# Patient Record
Sex: Male | Born: 1960
Health system: Southern US, Community
[De-identification: ages and names within clinical notes are randomized; demographics above are authoritative.]

## PROBLEM LIST (undated history)

## (undated) DIAGNOSIS — K5792 Diverticulitis of intestine, part unspecified, without perforation or abscess without bleeding: Secondary | ICD-10-CM

## (undated) DIAGNOSIS — E785 Hyperlipidemia, unspecified: Secondary | ICD-10-CM

## (undated) DIAGNOSIS — I10 Essential (primary) hypertension: Secondary | ICD-10-CM

## (undated) HISTORY — PX: LASIK: SHX215

## (undated) HISTORY — PX: COLONOSCOPY: SHX174

## (undated) HISTORY — PX: HERNIA REPAIR: SHX51

## (undated) HISTORY — PX: UPPER GASTROINTESTINAL ENDOSCOPY: SHX188

---

## 2012-12-04 ENCOUNTER — Other Ambulatory Visit: Payer: Self-pay | Admitting: Gastroenterology

## 2012-12-04 DIAGNOSIS — R109 Unspecified abdominal pain: Secondary | ICD-10-CM

## 2012-12-09 ENCOUNTER — Ambulatory Visit
Admission: RE | Admit: 2012-12-09 | Discharge: 2012-12-09 | Disposition: A | Discharge status: Home / Self Care | Payer: Commercial Managed Care - PPO | Source: Ambulatory Visit | Attending: Gastroenterology | Admitting: Gastroenterology

## 2012-12-09 DIAGNOSIS — R109 Unspecified abdominal pain: Secondary | ICD-10-CM

## 2012-12-09 MED ORDER — IOHEXOL 300 MG/ML  SOLN
125.0000 mL | Freq: Once | INTRAMUSCULAR | Status: AC | PRN
  Administered 2012-12-09: 125 mL via INTRAVENOUS

## 2014-09-17 ENCOUNTER — Encounter (HOSPITAL_BASED_OUTPATIENT_CLINIC_OR_DEPARTMENT_OTHER): Payer: Self-pay | Admitting: *Deleted

## 2014-09-17 ENCOUNTER — Emergency Department (HOSPITAL_BASED_OUTPATIENT_CLINIC_OR_DEPARTMENT_OTHER)
Admission: EM | Admit: 2014-09-17 | Discharge: 2014-09-17 | Disposition: A | Discharge status: Home / Self Care | Payer: 59 | Attending: Emergency Medicine | Admitting: Emergency Medicine

## 2014-09-17 ENCOUNTER — Emergency Department (HOSPITAL_BASED_OUTPATIENT_CLINIC_OR_DEPARTMENT_OTHER): Payer: 59

## 2014-09-17 DIAGNOSIS — Z880 Allergy status to penicillin: Secondary | ICD-10-CM | POA: Diagnosis not present

## 2014-09-17 DIAGNOSIS — I10 Essential (primary) hypertension: Secondary | ICD-10-CM | POA: Insufficient documentation

## 2014-09-17 DIAGNOSIS — S52022A Displaced fracture of olecranon process without intraarticular extension of left ulna, initial encounter for closed fracture: Secondary | ICD-10-CM

## 2014-09-17 DIAGNOSIS — Z79899 Other long term (current) drug therapy: Secondary | ICD-10-CM | POA: Diagnosis not present

## 2014-09-17 DIAGNOSIS — T1490XA Injury, unspecified, initial encounter: Secondary | ICD-10-CM

## 2014-09-17 DIAGNOSIS — Y92410 Unspecified street and highway as the place of occurrence of the external cause: Secondary | ICD-10-CM | POA: Diagnosis not present

## 2014-09-17 DIAGNOSIS — Y9389 Activity, other specified: Secondary | ICD-10-CM | POA: Diagnosis not present

## 2014-09-17 DIAGNOSIS — Z720 Tobacco use: Secondary | ICD-10-CM | POA: Insufficient documentation

## 2014-09-17 DIAGNOSIS — S59902A Unspecified injury of left elbow, initial encounter: Secondary | ICD-10-CM | POA: Diagnosis present

## 2014-09-17 HISTORY — DX: Essential (primary) hypertension: I10

## 2014-09-17 MED ORDER — PROMETHAZINE HCL 25 MG PO TABS: 25.0000 mg | ORAL_TABLET | Freq: Four times a day (QID) | ORAL | Status: DC | PRN

## 2014-09-17 MED ORDER — OXYCODONE-ACETAMINOPHEN 5-325 MG PO TABS: ORAL_TABLET | ORAL | Status: DC

## 2014-09-17 MED ORDER — MORPHINE SULFATE 4 MG/ML IJ SOLN
4.0000 mg | Freq: Once | INTRAMUSCULAR | Status: AC
  Administered 2014-09-17: 4 mg via INTRAMUSCULAR
  Filled 2014-09-17: qty 1

## 2014-09-17 MED ORDER — OXYCODONE-ACETAMINOPHEN 5-325 MG PO TABS
1.0000 | ORAL_TABLET | Freq: Once | ORAL | Status: DC
Start: 2014-09-17 — End: 2014-09-17
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
## 2014-09-17 NOTE — Discharge Instructions (Signed)
Do not eat or drink anything after midnight except for your medications with sips of water.  Go to Dr. Bettina Gavia office at 9 AM tomorrow.  Take percocet for breakthrough pain, do not drink alcohol, drive, care for children or do other critical tasks while taking percocet.  Please follow with your primary care doctor in the next 2 days for a check-up. They must obtain records for further management.   Do not hesitate to return to the Emergency Department for any new, worsening or concerning symptoms.    Elbow Fracture with Open Reduction and Internal Fixation (ORIF) A fracture (break in bone) of the elbow means one of the bones that comprises the elbow is broken. If fractures are not displaced (separated), they may be treated conservatively. That means that only a sling or splint may be required for two to three weeks. Often, elbow fractures are treated by early range of motion exercises to prevent the elbow from getting stiff. If fractures are large and not stable, an operation may be required to put the bones back into proper position and hold them in place with:  Pins.  Plates.  Screws. This is called open reduction and internal fixation (ORIF). The main goal of treating fractured elbows is to get the bones back into position and keep them in place. This goal gives the best chance of an elbow that:  Works as normally as possible.  Has the optimum range of motion. DIAGNOSIS  The diagnosis of a fractured elbow is made by x-ray. These will be required before and after the elbow is fixed. RISKS AND COMPLICATIONS All surgery is associated with risks. Some of these risks are:  Excessive bleeding.  Failure to heal properly (non-union).  Infection.  Stiffness of elbow following injury.  Damage to one of the nerves around the elbow producing numbness or weakness. LET YOUR CAREGIVER KNOW ABOUT:  Allergies.  Medications taken including herbs, eye drops, over the counter medications,  and creams.  Use of steroids (by mouth or creams).  Previous problems with anesthetics or novocaine.  Any numbness or tingling in your hand/forearm.  Possibility of pregnancy, if this applies.  History of blood clots (thrombophlebitis).  History of bleeding or blood problems.  Previous surgery.  Other health problems.  Family history of anesthetic problems PROCEDURE  You will be given an anesthetic which will keep you pain free during surgery. This will be accomplished by a general anesthetic (you go to sleep) or regional anesthesia (your arm is made numb). After the surgery you will be taken to the recovery area where a nurse will monitor your progress. When you are stable, taking fluids well and provided there are no complications, you will be allowed to return to your hospital room or possibly even go home. AFTER THE PROCEDURE   Only take over-the-counter or prescription medicines for pain, discomfort, or fever as directed by your caregiver.  You may use ice for 15-20 minutes, 03-04 times per day, for the first 2 to 3 days.  Change dressings and see your caregiver as directed.  Your caregiver will instruct you when to begin using and exercising your arm and elbow. SEEK IMMEDIATE MEDICAL CARE IF:  There is redness, swelling, or increasing pain in the surgical area.  Pus, blood or unusual drainage is coming from the area or is visible on the dressings or cast.  An unexplained oral temperature above 102 F (38.9 C) develops.  You notice a foul smell coming from the surgical site or  dressing.  The wound breaks open (edges are not staying together) after stitches have been removed.  You develop increasing pain or increasing pain with motion of your fingers.  There is numbness or tingling in your hand or forearm.  You have any other questions or concerns following surgery. Document Released: 04/24/2001 Document Revised: 01/21/2012 Document Reviewed: 11/15/2008 [facility]  Patient Information 2015 [place], [place]. This information is not intended to replace advice given to you by your health care provider. Make sure you discuss any questions you have with your health care provider.
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
## 2014-09-18 ENCOUNTER — Inpatient Hospital Stay (HOSPITAL_COMMUNITY): Payer: 59 | Admitting: Certified Registered Nurse Anesthetist

## 2014-09-18 ENCOUNTER — Encounter (HOSPITAL_COMMUNITY): Admission: EM | Disposition: A | Discharge status: Home / Self Care | Payer: Self-pay | Attending: Orthopedic Surgery

## 2014-09-18 ENCOUNTER — Ambulatory Visit (HOSPITAL_COMMUNITY)
Admission: EM | Admit: 2014-09-18 | Discharge: 2014-09-18 | Disposition: A | Discharge status: Home / Self Care | Payer: 59 | Source: Intra-hospital | Attending: Orthopedic Surgery | Admitting: Orthopedic Surgery

## 2014-09-18 ENCOUNTER — Ambulatory Visit (HOSPITAL_COMMUNITY): Admission: EM | Admit: 2014-09-18 | Payer: 59

## 2014-09-18 ENCOUNTER — Encounter (HOSPITAL_COMMUNITY): Payer: Self-pay | Admitting: *Deleted

## 2014-09-18 DIAGNOSIS — Y998 Other external cause status: Secondary | ICD-10-CM | POA: Diagnosis not present

## 2014-09-18 DIAGNOSIS — F17210 Nicotine dependence, cigarettes, uncomplicated: Secondary | ICD-10-CM | POA: Insufficient documentation

## 2014-09-18 DIAGNOSIS — Z880 Allergy status to penicillin: Secondary | ICD-10-CM | POA: Diagnosis not present

## 2014-09-18 DIAGNOSIS — I10 Essential (primary) hypertension: Secondary | ICD-10-CM | POA: Diagnosis not present

## 2014-09-18 DIAGNOSIS — Y92488 Other paved roadways as the place of occurrence of the external cause: Secondary | ICD-10-CM | POA: Insufficient documentation

## 2014-09-18 DIAGNOSIS — Y93I9 Activity, other involving external motion: Secondary | ICD-10-CM | POA: Insufficient documentation

## 2014-09-18 DIAGNOSIS — S52022A Displaced fracture of olecranon process without intraarticular extension of left ulna, initial encounter for closed fracture: Secondary | ICD-10-CM | POA: Insufficient documentation

## 2014-09-18 HISTORY — PX: ORIF ELBOW FRACTURE: SHX5031

## 2014-09-18 LAB — BASIC METABOLIC PANEL
Anion gap: 15 (ref 5–15)
BUN: 13 mg/dL (ref 6–23)
CO2: 24 mEq/L (ref 19–32)
Calcium: 9.6 mg/dL (ref 8.4–10.5)
Chloride: 99 mEq/L (ref 96–112)
Creatinine, Ser: 0.98 mg/dL (ref 0.50–1.35)
GFR calc Af Amer: >90 mL/min (ref >90)
GFR calc non Af Amer: >90 mL/min (ref >90)
Glucose, Bld: 87 mg/dL (ref 70–99)
Potassium: 3.7 mEq/L (ref 3.7–5.3)
Sodium: 138 mEq/L (ref 137–147)

## 2014-09-18 LAB — CBC
HCT: 46.7 % (ref 39.0–52.0)
Hemoglobin: 16.2 g/dL (ref 13.0–17.0)
MCH: 30.7 pg (ref 26.0–34.0)
MCHC: 34.7 g/dL (ref 30.0–36.0)
MCV: 88.4 fL (ref 78.0–100.0)
Platelets: 314 10*3/uL (ref 150–400)
RBC: 5.28 MIL/uL (ref 4.22–5.81)
RDW: 13.7 % (ref 11.5–15.5)
WBC: 10.5 10*3/uL (ref 4.0–10.5)

## 2014-09-18 SURGERY — OPEN REDUCTION INTERNAL FIXATION (ORIF) ELBOW/OLECRANON FRACTURE
Anesthesia: General | Site: Elbow | Laterality: Left

## 2014-09-18 SURGERY — OPEN REDUCTION INTERNAL FIXATION (ORIF) ELBOW/OLECRANON FRACTURE
Anesthesia: General | Laterality: Left

## 2014-09-18 MED ORDER — HYDROMORPHONE HCL 1 MG/ML IJ SOLN
INTRAMUSCULAR | Status: AC
  Administered 2014-09-18: 0.5 mg via INTRAVENOUS
  Filled 2014-09-18: qty 1

## 2014-09-18 MED ORDER — OXYCODONE HCL 5 MG PO TABS
ORAL_TABLET | ORAL | Status: AC
  Administered 2014-09-18: 5 mg via ORAL
  Filled 2014-09-18: qty 1

## 2014-09-18 MED ORDER — PROPOFOL 10 MG/ML IV BOLUS
INTRAVENOUS | Status: DC | PRN
  Administered 2014-09-18: 200 mg via INTRAVENOUS

## 2014-09-18 MED ORDER — BUPIVACAINE-EPINEPHRINE (PF) 0.25% -1:200000 IJ SOLN
INTRAMUSCULAR | Status: AC
  Filled 2014-09-18: qty 30

## 2014-09-18 MED ORDER — OXYCODONE-ACETAMINOPHEN 5-325 MG PO TABS
ORAL_TABLET | ORAL | Status: DC
Start: 2014-09-18 — End: 2017-02-28

## 2014-09-18 MED ORDER — OXYCODONE HCL 5 MG/5ML PO SOLN: 5.0000 mg | Freq: Once | ORAL | Status: AC | PRN

## 2014-09-18 MED ORDER — BUPIVACAINE-EPINEPHRINE 0.25% -1:200000 IJ SOLN
INTRAMUSCULAR | Status: DC | PRN
  Administered 2014-09-18: 20 mL

## 2014-09-18 MED ORDER — FENTANYL CITRATE 0.05 MG/ML IJ SOLN
INTRAMUSCULAR | Status: DC | PRN
  Administered 2014-09-18: 25 ug via INTRAVENOUS
  Administered 2014-09-18: 50 ug via INTRAVENOUS
  Administered 2014-09-18 (×3): 25 ug via INTRAVENOUS
  Administered 2014-09-18 (×2): 50 ug via INTRAVENOUS

## 2014-09-18 MED ORDER — HYDROMORPHONE HCL 1 MG/ML IJ SOLN
0.2500 mg | INTRAMUSCULAR | Status: DC | PRN
  Administered 2014-09-18 (×4): 0.5 mg via INTRAVENOUS

## 2014-09-18 MED ORDER — EPHEDRINE SULFATE 50 MG/ML IJ SOLN
INTRAMUSCULAR | Status: DC | PRN
Start: 2014-09-18 — End: 2014-09-18
  Administered 2014-09-18: 5 mg via INTRAVENOUS

## 2014-09-18 MED ORDER — LACTATED RINGERS IV SOLN
INTRAVENOUS | Status: DC | PRN
  Administered 2014-09-18: 12:00:00 via INTRAVENOUS

## 2014-09-18 MED ORDER — LACTATED RINGERS IV SOLN
INTRAVENOUS | Status: DC
  Administered 2014-09-18: 12:00:00 via INTRAVENOUS

## 2014-09-18 MED ORDER — MIDAZOLAM HCL 5 MG/5ML IJ SOLN
INTRAMUSCULAR | Status: DC | PRN
  Administered 2014-09-18: 2 mg via INTRAVENOUS

## 2014-09-18 MED ORDER — 0.9 % SODIUM CHLORIDE (POUR BTL) OPTIME
TOPICAL | Status: DC | PRN
  Administered 2014-09-18: 1000 mL

## 2014-09-18 MED ORDER — MIDAZOLAM HCL 2 MG/2ML IJ SOLN
INTRAMUSCULAR | Status: AC
  Filled 2014-09-18: qty 2

## 2014-09-18 MED ORDER — PROPOFOL 10 MG/ML IV BOLUS
INTRAVENOUS | Status: AC
  Filled 2014-09-18: qty 20

## 2014-09-18 MED ORDER — OXYCODONE HCL 5 MG PO TABS
5.0000 mg | ORAL_TABLET | Freq: Once | ORAL | Status: AC | PRN
  Administered 2014-09-18: 5 mg via ORAL

## 2014-09-18 MED ORDER — FENTANYL CITRATE 0.05 MG/ML IJ SOLN
INTRAMUSCULAR | Status: AC
  Filled 2014-09-18: qty 5

## 2014-09-18 MED ORDER — LIDOCAINE HCL (CARDIAC) 20 MG/ML IV SOLN
INTRAVENOUS | Status: DC | PRN
  Administered 2014-09-18: 80 mg via INTRAVENOUS

## 2014-09-18 MED ORDER — SUCCINYLCHOLINE CHLORIDE 20 MG/ML IJ SOLN
INTRAMUSCULAR | Status: DC | PRN
  Administered 2014-09-18: 150 mg via INTRAVENOUS

## 2014-09-18 MED ORDER — ONDANSETRON HCL 4 MG/2ML IJ SOLN
INTRAMUSCULAR | Status: DC | PRN
  Administered 2014-09-18: 4 mg via INTRAVENOUS

## 2014-09-18 MED ORDER — PROMETHAZINE HCL 25 MG/ML IJ SOLN: 6.2500 mg | INTRAMUSCULAR | Status: DC | PRN

## 2014-09-18 MED ORDER — CEFAZOLIN SODIUM-DEXTROSE 2-3 GM-% IV SOLR
2.0000 g | INTRAVENOUS | Status: AC
  Administered 2014-09-18: 2 g via INTRAVENOUS

## 2014-09-18 SURGICAL SUPPLY — 73 items
0.62 K WIRE ×2 IMPLANT
BANDAGE ELASTIC 3 VELCRO ST LF (GAUZE/BANDAGES/DRESSINGS) ×2 IMPLANT
BANDAGE ELASTIC 4 VELCRO ST LF (GAUZE/BANDAGES/DRESSINGS) ×4 IMPLANT
BIT DRILL 2.0 LNG QUCK RELEASE (BIT) ×1 IMPLANT
BIT DRILL 2.8 QUICK RELEASE (BIT) ×1 IMPLANT
BLADE SURG ROTATE 9660 (MISCELLANEOUS) IMPLANT
BNDG COHESIVE 4X5 TAN STRL (GAUZE/BANDAGES/DRESSINGS) ×2 IMPLANT
BNDG ESMARK 4X9 LF (GAUZE/BANDAGES/DRESSINGS) ×2 IMPLANT
BNDG GAUZE ELAST 4 BULKY (GAUZE/BANDAGES/DRESSINGS) ×2 IMPLANT
CHLORAPREP W/TINT 26ML (MISCELLANEOUS) ×2 IMPLANT
COVER SURGICAL LIGHT HANDLE (MISCELLANEOUS) ×2 IMPLANT
CUFF TOURNIQUET SINGLE 18IN (TOURNIQUET CUFF) ×2 IMPLANT
CUFF TOURNIQUET SINGLE 24IN (TOURNIQUET CUFF) IMPLANT
DRAPE INCISE IOBAN 66X45 STRL (DRAPES) ×2 IMPLANT
DRAPE OEC MINIVIEW 54X84 (DRAPES) IMPLANT
DRAPE ORTHO SPLIT 77X108 STRL (DRAPES) ×1
DRAPE SURG ORHT 6 SPLT 77X108 (DRAPES) ×1 IMPLANT
DRAPE U-SHAPE 47X51 STRL (DRAPES) ×2 IMPLANT
DRILL 2.0 LNG QUICK RELEASE (BIT) ×2
DRILL 2.8 QUICK RELEASE (BIT) ×2
ELECT REM PT RETURN 9FT ADLT (ELECTROSURGICAL)
ELECTRODE REM PT RTRN 9FT ADLT (ELECTROSURGICAL) IMPLANT
GAUZE SPONGE 4X4 12PLY STRL (GAUZE/BANDAGES/DRESSINGS) ×2 IMPLANT
GAUZE XEROFORM 5X9 LF (GAUZE/BANDAGES/DRESSINGS) ×2 IMPLANT
GLOVE BIO SURGEON STRL SZ7 (GLOVE) ×2 IMPLANT
GLOVE BIO SURGEON STRL SZ7.5 (GLOVE) ×2 IMPLANT
GLOVE BIOGEL PI IND STRL 8 (GLOVE) ×1 IMPLANT
GLOVE BIOGEL PI INDICATOR 8 (GLOVE) ×1
GOWN STRL REUS W/ TWL LRG LVL3 (GOWN DISPOSABLE) ×4 IMPLANT
GOWN STRL REUS W/ TWL XL LVL3 (GOWN DISPOSABLE) ×1 IMPLANT
GOWN STRL REUS W/TWL LRG LVL3 (GOWN DISPOSABLE) ×4
GOWN STRL REUS W/TWL XL LVL3 (GOWN DISPOSABLE) ×1
KIT BASIN OR (CUSTOM PROCEDURE TRAY) ×2 IMPLANT
KIT ROOM TURNOVER OR (KITS) ×2 IMPLANT
LOOP VESSEL MAXI BLUE (MISCELLANEOUS) IMPLANT
MANIFOLD NEPTUNE II (INSTRUMENTS) ×2 IMPLANT
NEEDLE 22X1 1/2 (OR ONLY) (NEEDLE) IMPLANT
NEEDLE HYPO 25GX1X1/2 BEV (NEEDLE) ×2 IMPLANT
NS IRRIG 1000ML POUR BTL (IV SOLUTION) ×2 IMPLANT
PACK ORTHO EXTREMITY (CUSTOM PROCEDURE TRAY) ×2 IMPLANT
PAD ARMBOARD 7.5X6 YLW CONV (MISCELLANEOUS) ×4 IMPLANT
PAD CAST 4YDX4 CTTN HI CHSV (CAST SUPPLIES) ×1 IMPLANT
PADDING CAST COTTON 4X4 STRL (CAST SUPPLIES) ×1
PADDING CAST COTTON 6X4 STRL (CAST SUPPLIES) ×6 IMPLANT
PLATE LEFT OLECRANON 3HOLE (Plate) ×2 IMPLANT
SCREW HEX NON LOCK 3.5X34MM (Screw) ×2 IMPLANT
SCREW HEX NON LOCK 3.5X55MM (Screw) ×2 IMPLANT
SCREW LOCK 18X2.7X HEXALOBE (Screw) ×2 IMPLANT
SCREW LOCK 20X2.7X HEXALOBE (Screw) ×2 IMPLANT
SCREW LOCKING 2.7X18MM (Screw) ×2 IMPLANT
SCREW LOCKING 2.7X20MM (Screw) ×2 IMPLANT
SCREW NON LOCKING HEX 3.5X24 (Screw) ×2 IMPLANT
SCREW NON LOCKING HEX 3.5X30 (Screw) ×2 IMPLANT
SLING ARM IMMOBILIZER XL (CAST SUPPLIES) ×2 IMPLANT
SPLINT PLASTER CAST XFAST 5X30 (CAST SUPPLIES) ×1 IMPLANT
SPLINT PLASTER XFAST SET 5X30 (CAST SUPPLIES) ×1
SPONGE GAUZE 4X4 12PLY STER LF (GAUZE/BANDAGES/DRESSINGS) ×2 IMPLANT
SPONGE LAP 18X18 X RAY DECT (DISPOSABLE) ×2 IMPLANT
SPONGE LAP 4X18 X RAY DECT (DISPOSABLE) ×2 IMPLANT
STAPLER VISISTAT 35W (STAPLE) ×2 IMPLANT
STOCKINETTE IMPERVIOUS 9X36 MD (GAUZE/BANDAGES/DRESSINGS) ×2 IMPLANT
SUCTION FRAZIER TIP 10 FR DISP (SUCTIONS) ×2 IMPLANT
SUT ETHILON 4 0 PS 2 18 (SUTURE) ×2 IMPLANT
SUT VIC AB 0 CT1 27 (SUTURE) ×1
SUT VIC AB 0 CT1 27XBRD ANBCTR (SUTURE) ×1 IMPLANT
SUT VIC AB 2-0 CT1 36 (SUTURE) ×2 IMPLANT
SUT VIC AB 2-0 FS1 27 (SUTURE) ×2 IMPLANT
SYR CONTROL 10ML LL (SYRINGE) ×2 IMPLANT
TOWEL OR 17X24 6PK STRL BLUE (TOWEL DISPOSABLE) ×2 IMPLANT
TOWEL OR 17X26 10 PK STRL BLUE (TOWEL DISPOSABLE) ×2 IMPLANT
TUBE CONNECTING 12X1/4 (SUCTIONS) ×2 IMPLANT
WATER STERILE IRR 1000ML POUR (IV SOLUTION) ×2 IMPLANT
YANKAUER SUCT BULB TIP NO VENT (SUCTIONS) ×2 IMPLANT

## 2014-09-18 NOTE — Anesthesia Postprocedure Evaluation (Signed)
  Anesthesia Post-op Note  Patient: Dakota Santos  Procedure(s) Performed: Procedure(s): OPEN REDUCTION INTERNAL FIXATION (ORIF) ELBOW/OLECRANON FRACTURE LEFT (Left)  Patient Location: PACU  Anesthesia Type:General  Level of Consciousness: awake and alert   Airway and Oxygen Therapy: Patient Spontanous Breathing  Post-op Pain: mild  Post-op Assessment: Post-op Vital signs reviewed  Post-op Vital Signs: stable  Last Vitals:  Filed Vitals:   09/18/14 1620  BP:   Pulse:   Temp: 36.4 C  Resp:     Complications: No apparent anesthesia complications

## 2014-09-18 NOTE — Anesthesia Preprocedure Evaluation (Addendum)
Anesthesia Evaluation  Patient identified by MRN, date of birth, ID band Patient awake    Reviewed: Allergy & Precautions, H&P , NPO status , Patient's Chart, lab work & pertinent test results  Airway Mallampati: I  TM Distance: >3 FB Neck ROM: Full    Dental  (+) Teeth Intact   Pulmonary Current Smoker,  breath sounds clear to auscultation        Cardiovascular hypertension, Pt. on medications Rhythm:Regular Rate:Normal     Neuro/Psych    GI/Hepatic   Endo/Other    Renal/GU      Musculoskeletal   Abdominal   Peds  Hematology   Anesthesia Other Findings   Reproductive/Obstetrics                           Anesthesia Physical Anesthesia Plan  ASA: II and emergent  Anesthesia Plan: General   Post-op Pain Management:    Induction: Intravenous  Airway Management Planned: LMA  Additional Equipment:   Intra-op Plan:   Post-operative Plan: Extubation in OR  Informed Consent:   Dental advisory given  Plan Discussed with: Anesthesiologist, Surgeon and CRNA  Anesthesia Plan Comments:       Anesthesia Quick Evaluation

## 2014-09-18 NOTE — Progress Notes (Signed)
   09/18/14 1149  OBSTRUCTIVE SLEEP APNEA  Have you ever been diagnosed with sleep apnea through a sleep study? No  Do you snore loudly (loud enough to be heard through closed doors)?  1  Do you often feel tired, fatigued, or sleepy during the daytime? 0  Has anyone observed you stop breathing during your sleep? 0  Do you have, or are you being treated for high blood pressure? 1  BMI more than 35 kg/m2? 0  Age over 52 years old? 1  Neck circumference greater than 40 cm/16 inches? 1  Gender: 1  Obstructive Sleep Apnea Score 5  Score 4 or greater  Results sent to PCP
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
## 2014-09-18 NOTE — Transfer of Care (Signed)
Immediate Anesthesia Transfer of Care Note  Patient: Dakota Santos  Procedure(s) Performed: Procedure(s): OPEN REDUCTION INTERNAL FIXATION (ORIF) ELBOW/OLECRANON FRACTURE LEFT (Left)  Patient Location: PACU  Anesthesia Type:General  Level of Consciousness: awake, alert  and oriented  Airway & Oxygen Therapy: Patient Spontanous Breathing  Post-op Assessment: Report given to PACU RN  Post vital signs: Reviewed and stable  Complications: No apparent anesthesia complications
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
## 2014-09-21 ENCOUNTER — Encounter (HOSPITAL_COMMUNITY): Payer: Self-pay | Admitting: Orthopedic Surgery

## 2014-11-29 ENCOUNTER — Other Ambulatory Visit: Payer: Self-pay | Admitting: Orthopedic Surgery

## 2014-11-29 DIAGNOSIS — M25512 Pain in left shoulder: Secondary | ICD-10-CM

## 2014-12-04 ENCOUNTER — Other Ambulatory Visit: Payer: Commercial Managed Care - PPO

## 2014-12-07 ENCOUNTER — Ambulatory Visit
Admission: RE | Admit: 2014-12-07 | Discharge: 2014-12-07 | Disposition: A | Discharge status: Home / Self Care | Payer: PRIVATE HEALTH INSURANCE | Source: Ambulatory Visit | Attending: Orthopedic Surgery | Admitting: Orthopedic Surgery

## 2014-12-07 DIAGNOSIS — M25512 Pain in left shoulder: Secondary | ICD-10-CM

## 2016-12-17 DIAGNOSIS — R748 Abnormal levels of other serum enzymes: Secondary | ICD-10-CM | POA: Diagnosis not present

## 2016-12-25 ENCOUNTER — Encounter (HOSPITAL_COMMUNITY): Payer: Self-pay | Admitting: Emergency Medicine

## 2016-12-25 ENCOUNTER — Emergency Department (HOSPITAL_COMMUNITY): Payer: 59

## 2016-12-25 ENCOUNTER — Emergency Department (HOSPITAL_COMMUNITY)
Admission: EM | Admit: 2016-12-25 | Discharge: 2016-12-25 | Disposition: A | Discharge status: Home / Self Care | Payer: 59 | Attending: Emergency Medicine | Admitting: Emergency Medicine

## 2016-12-25 DIAGNOSIS — K5732 Diverticulitis of large intestine without perforation or abscess without bleeding: Secondary | ICD-10-CM

## 2016-12-25 DIAGNOSIS — N400 Enlarged prostate without lower urinary tract symptoms: Secondary | ICD-10-CM

## 2016-12-25 DIAGNOSIS — D72829 Elevated white blood cell count, unspecified: Secondary | ICD-10-CM | POA: Insufficient documentation

## 2016-12-25 DIAGNOSIS — F17210 Nicotine dependence, cigarettes, uncomplicated: Secondary | ICD-10-CM | POA: Diagnosis not present

## 2016-12-25 DIAGNOSIS — R911 Solitary pulmonary nodule: Secondary | ICD-10-CM | POA: Diagnosis not present

## 2016-12-25 DIAGNOSIS — I10 Essential (primary) hypertension: Secondary | ICD-10-CM | POA: Insufficient documentation

## 2016-12-25 DIAGNOSIS — D729 Disorder of white blood cells, unspecified: Secondary | ICD-10-CM

## 2016-12-25 DIAGNOSIS — R109 Unspecified abdominal pain: Secondary | ICD-10-CM | POA: Diagnosis not present

## 2016-12-25 DIAGNOSIS — K4020 Bilateral inguinal hernia, without obstruction or gangrene, not specified as recurrent: Secondary | ICD-10-CM | POA: Diagnosis not present

## 2016-12-25 DIAGNOSIS — Z79899 Other long term (current) drug therapy: Secondary | ICD-10-CM | POA: Diagnosis not present

## 2016-12-25 DIAGNOSIS — R110 Nausea: Secondary | ICD-10-CM

## 2016-12-25 DIAGNOSIS — D72828 Other elevated white blood cell count: Secondary | ICD-10-CM

## 2016-12-25 DIAGNOSIS — R1032 Left lower quadrant pain: Secondary | ICD-10-CM

## 2016-12-25 LAB — URINALYSIS, ROUTINE W REFLEX MICROSCOPIC
Bilirubin Urine: NEGATIVE
Glucose, UA: NEGATIVE mg/dL
Hgb urine dipstick: NEGATIVE
Ketones, ur: 5 mg/dL — AB
Leukocytes, UA: NEGATIVE
Nitrite: NEGATIVE
Protein, ur: NEGATIVE mg/dL
Specific Gravity, Urine: 1.011 (ref 1.005–1.030)
pH: 6 (ref 5.0–8.0)

## 2016-12-25 LAB — DIFFERENTIAL
Basophils Absolute: 0 10*3/uL (ref 0.0–0.1)
Basophils Relative: 0 %
Eosinophils Absolute: 0.1 10*3/uL (ref 0.0–0.7)
Eosinophils Relative: 1 %
Lymphocytes Relative: 18 %
Lymphs Abs: 2.7 10*3/uL (ref 0.7–4.0)
Monocytes Absolute: 0.6 10*3/uL (ref 0.1–1.0)
Monocytes Relative: 4 %
Neutro Abs: 12.1 10*3/uL — ABNORMAL HIGH (ref 1.7–7.7)
Neutrophils Relative %: 77 %

## 2016-12-25 LAB — CBC
HCT: 47.2 % (ref 39.0–52.0)
Hemoglobin: 16.5 g/dL (ref 13.0–17.0)
MCH: 30.8 pg (ref 26.0–34.0)
MCHC: 35 g/dL (ref 30.0–36.0)
MCV: 88.2 fL (ref 78.0–100.0)
Platelets: 379 10*3/uL (ref 150–400)
RBC: 5.35 MIL/uL (ref 4.22–5.81)
RDW: 13.4 % (ref 11.5–15.5)
WBC: 15.2 10*3/uL — ABNORMAL HIGH (ref 4.0–10.5)

## 2016-12-25 LAB — COMPREHENSIVE METABOLIC PANEL
ALT: 47 U/L (ref 17–63)
AST: 26 U/L (ref 15–41)
Albumin: 4.5 g/dL (ref 3.5–5.0)
Alkaline Phosphatase: 62 U/L (ref 38–126)
Anion gap: 10 (ref 5–15)
BUN: 13 mg/dL (ref 6–20)
CO2: 23 mmol/L (ref 22–32)
Calcium: 9.9 mg/dL (ref 8.9–10.3)
Chloride: 101 mmol/L (ref 101–111)
Creatinine, Ser: 1.1 mg/dL (ref 0.61–1.24)
GFR calc Af Amer: >60 mL/min (ref >60)
GFR calc non Af Amer: >60 mL/min (ref >60)
Glucose, Bld: 93 mg/dL (ref 65–99)
Potassium: 3.9 mmol/L (ref 3.5–5.1)
Sodium: 134 mmol/L — ABNORMAL LOW (ref 135–145)
Total Bilirubin: 0.8 mg/dL (ref 0.3–1.2)
Total Protein: 7.5 g/dL (ref 6.5–8.1)

## 2016-12-25 LAB — LIPASE, BLOOD: Lipase: 24 U/L (ref 11–51)

## 2016-12-25 MED ORDER — METRONIDAZOLE 500 MG PO TABS
500.0000 mg | ORAL_TABLET | Freq: Once | ORAL | Status: AC
  Administered 2016-12-25: 500 mg via ORAL
  Filled 2016-12-25: qty 1

## 2016-12-25 MED ORDER — IOPAMIDOL (ISOVUE-300) INJECTION 61%
INTRAVENOUS | Status: AC
  Administered 2016-12-25: 100 mL
  Filled 2016-12-25: qty 100

## 2016-12-25 MED ORDER — CIPROFLOXACIN HCL 500 MG PO TABS
500.0000 mg | ORAL_TABLET | Freq: Once | ORAL | Status: AC
  Administered 2016-12-25: 500 mg via ORAL
  Filled 2016-12-25: qty 1

## 2016-12-25 MED ORDER — CIPROFLOXACIN HCL 500 MG PO TABS: 500.0000 mg | ORAL_TABLET | Freq: Two times a day (BID) | ORAL | 0 refills | Status: AC

## 2016-12-25 MED ORDER — HYDROCODONE-ACETAMINOPHEN 5-325 MG PO TABS
1.0000 | ORAL_TABLET | Freq: Once | ORAL | Status: AC
  Administered 2016-12-25: 1 via ORAL
  Filled 2016-12-25: qty 1

## 2016-12-25 MED ORDER — HYDROCODONE-ACETAMINOPHEN 5-325 MG PO TABS: 1.0000 | ORAL_TABLET | Freq: Four times a day (QID) | ORAL | 0 refills | Status: DC | PRN

## 2016-12-25 MED ORDER — ACETAMINOPHEN 325 MG PO TABS
650.0000 mg | ORAL_TABLET | Freq: Once | ORAL | Status: AC
  Administered 2016-12-25: 650 mg via ORAL
  Filled 2016-12-25: qty 2

## 2016-12-25 MED ORDER — SODIUM CHLORIDE 0.9 % IV BOLUS (SEPSIS)
1000.0000 mL | Freq: Once | INTRAVENOUS | Status: AC
  Administered 2016-12-25: 1000 mL via INTRAVENOUS

## 2016-12-25 MED ORDER — ONDANSETRON 4 MG PO TBDP: 4.0000 mg | ORAL_TABLET | Freq: Three times a day (TID) | ORAL | 0 refills | Status: DC | PRN

## 2016-12-25 MED ORDER — METRONIDAZOLE 500 MG PO TABS: 500.0000 mg | ORAL_TABLET | Freq: Three times a day (TID) | ORAL | 0 refills | Status: AC

## 2016-12-25 NOTE — Discharge Instructions (Signed)
Your abdominal pain is likely due to diverticulitis, which was found on your CT today. Take the antibiotics as directed until completed. Alternate between tylenol and motrin as needed for pain, using norco as directed as needed for severe pain but don't drive while taking that medication. Use zofran as directed as needed for nausea. Stay well hydrated. Increase fiber and water intake in your diet. Follow up with your gastroenterologist in 1-2 weeks for recheck of symptoms and ongoing management of your diverticulitis. Your CT today showed an abnormality on your left lower lung, you will need to follow up with your primary care doctor in 1-2 weeks for ongoing evaluation and management of that finding (CT today recommended follow up CT of your chest in 3 months). Return to the ER for emergent changes or worsening symptoms.

## 2016-12-25 NOTE — ED Triage Notes (Signed)
Pt sts lower abd pain with cramping; pt sts hx of same but worse over last couple of weeks

## 2016-12-25 NOTE — ED Provider Notes (Signed)
[facility] DEPT Provider Note   CSN: [id number] Arrival date & time: 12/25/16  1057     History   Chief Complaint Chief Complaint  Patient presents with  . Abdominal Pain    HPI Dakota Santos is a 56 y.o. male with a PMHx of HTN, HLD, and ?IBS vs colitis, and a PSHx of ventral hernia repair 48yrs ago, who presents to the ED with complaints of recurrence of his left lower quadrant abdominal pain. Patient states that he previously had crampy LLQ abdominal pain, was seen by Dr. Paulita Fujita many years ago and has had multiple colonoscopies (last one 1.42yrs ago, only showed polyps), EGDs, and CT scans (reportedly normal), and was presumptively diagnosed with IBS or colitis, given Bentyl 10 mg that he was instructed to take as needed. Patient states that he has not had any issues with his abdomen in many years, he hasn't seen Dr. Paulita Fujita in ~33yrs and has done well until about 3 weeks ago when his abdominal pain returned intermittently but gradually worsened over the last few days and has become more constant. He describes his abdominal pain as currently 4/10 constant waxing and waning nonradiating crampy LLQ abdominal pain, worse with movement, and minimally improved with Bentyl. Associated symptoms include nausea. He has not tried anything else prior to arrival for his symptoms. He states that he was recently started on Ezetimbe for his cholesterol, ~5wks ago, and wonders whether this could be causing his return of symptoms. He denies recent travel, sick contacts, suspicious food intake, frequent or recent EtOH use (drinks socially), or chronic NSAID use. No other prior abd surgeries aside from ventral hernia repair 62yrs ago. He had a normal BM this morning, denies that it was hard. Has not eaten anything since last night's dinner, drank a little water and a cup of coffee this morning but otherwise has been NPO since last night. Has an appt with Dr. Paulita Fujita on 01/08/17. Denies that he's had abnormal CTs in  the past, and colonoscopies have only ever shown polyps. At one time, Dr. Paulita Fujita had mentioned to him that maybe he should go to [place] in order to have a pill-camera study done, however he did not have that done.  He denies fevers, chills, CP, SOB, V/D/C, obstipation, melena, hematochezia, testicular pain/swelling, penile discharge, hematuria, dysuria, myalgias, arthralgias, numbness, tingling, focal weakness, or any other complaints at this time.    The history is provided by the patient and medical records. No language interpreter was used.  Abdominal Pain   This is a recurrent problem. The current episode started more than 1 week ago. The problem occurs constantly. The problem has been gradually worsening. Associated with: new medication. The pain is located in the LLQ. The quality of the pain is cramping. The pain is at a severity of 4/10. The pain is mild. Associated symptoms include nausea. Pertinent negatives include fever, diarrhea, flatus, hematochezia, melena, vomiting, constipation, dysuria, hematuria, arthralgias and myalgias. The symptoms are aggravated by activity. Relieved by: bentyl. Past workup includes GI consult and CT scan. His past medical history is significant for irritable bowel syndrome.    Past Medical History:  Diagnosis Date  . Hypertension     There are no active problems to display for this patient.   Past Surgical History:  Procedure Laterality Date  . HERNIA REPAIR    . ORIF ELBOW FRACTURE Left 09/18/2014   Procedure: OPEN REDUCTION INTERNAL FIXATION (ORIF) ELBOW/OLECRANON FRACTURE LEFT;  Surgeon: Nita Sells, MD;  [facility]  [facility];  Service: Orthopedics;  Laterality: Left;       Home Medications    Prior to Admission medications   Medication Sig Start Date End Date Taking? Authorizing Provider  LISINOPRIL PO Take by mouth.    Historical Provider, MD  lisinopril-hydrochlorothiazide (PRINZIDE,ZESTORETIC) 20-25 MG per tablet Take 1 tablet by  mouth daily.    Historical Provider, MD  oxyCODONE-acetaminophen (PERCOCET/ROXICET) 5-325 MG per tablet 1 to 2 tabs PO q6hrs  PRN for pain 09/18/14   Loni Dolly, PA-C  promethazine (PHENERGAN) 25 MG tablet Take 1 tablet (25 mg total) by mouth every 6 (six) hours as needed for nausea or vomiting. 09/17/14   Elmyra Ricks Pisciotta, PA-C    Family History History reviewed. No pertinent family history.  Social History Social History  Substance Use Topics  . Smoking status: Current Every Day Smoker    Packs/day: 0.50    Types: Cigarettes  . Smokeless tobacco: Not on file  . Alcohol use Yes     Comment: couple beers once a month     Allergies   Penicillins   Review of Systems Review of Systems  Constitutional: Negative for chills and fever.  Respiratory: Negative for shortness of breath.   Cardiovascular: Negative for chest pain.  Gastrointestinal: Positive for abdominal pain and nausea. Negative for blood in stool, constipation, diarrhea, flatus, hematochezia, melena and vomiting.  Genitourinary: Negative for discharge, dysuria, hematuria, scrotal swelling and testicular pain.  Musculoskeletal: Negative for arthralgias and myalgias.  Skin: Negative for color change.  Allergic/Immunologic: Negative for immunocompromised state.  Neurological: Negative for weakness and numbness.  Psychiatric/Behavioral: Negative for confusion.   10 Systems reviewed and are negative for acute change except as noted in the HPI.   Physical Exam Updated Vital Signs BP 128/88 (BP Location: Left Arm)   Pulse 67   Temp 97.8 F (36.6 C) (Oral)   Resp 18   SpO2 97%   Physical Exam  Constitutional: He is oriented to person, place, and time. Vital signs are normal. He appears well-developed and well-nourished.  Non-toxic appearance. No distress.  Afebrile, nontoxic, NAD  HENT:  Head: Normocephalic and atraumatic.  Mouth/Throat: Oropharynx is clear and moist and mucous membranes are normal.  Eyes:  Conjunctivae and EOM are normal. Right eye exhibits no discharge. Left eye exhibits no discharge.  Neck: Normal range of motion. Neck supple.  Cardiovascular: Normal rate, regular rhythm, normal heart sounds and intact distal pulses.  Exam reveals no gallop and no friction rub.   No murmur heard. Pulmonary/Chest: Effort normal and breath sounds normal. No respiratory distress. He has no decreased breath sounds. He has no wheezes. He has no rhonchi. He has no rales.  Abdominal: Soft. Normal appearance. He exhibits distension (vs obese body habitus). Bowel sounds are decreased. There is tenderness in the left lower quadrant. There is no rigidity, no rebound, no guarding, no CVA tenderness, no tenderness at McBurney's point and negative Murphy's sign. A hernia is present. Hernia confirmed positive in the ventral area (diastasis recti, midline surgical incision noted).    Soft, +diastasis recti noted with abdominal muscle activation/crunches but resolves after relaxation, healed midline ventral hernia repair scar noted; obese so difficult to tell if there's any distension vs normal body habitus/protuberant abdomen; +BS throughout although slightly hypoactive diffusely, with mild LLQ TTP, no r/g/r, neg murphy's, neg mcburney's, no CVA TTP   Musculoskeletal: Normal range of motion.  Neurological: He is alert and oriented to person, place, and time. He has normal strength.  No sensory deficit.  Skin: Skin is warm, dry and intact. No rash noted.  Psychiatric: He has a normal mood and affect.  Nursing note and vitals reviewed.    ED Treatments / Results  Labs (all labs ordered are listed, but only abnormal results are displayed) Labs Reviewed  COMPREHENSIVE METABOLIC PANEL - Abnormal; Notable for the following:       Result Value   Sodium 134 (*)    All other components within normal limits  CBC - Abnormal; Notable for the following:    WBC 15.2 (*)    All other components within normal limits    URINALYSIS, ROUTINE W REFLEX MICROSCOPIC - Abnormal; Notable for the following:    Color, Urine STRAW (*)    Ketones, ur 5 (*)    All other components within normal limits  DIFFERENTIAL - Abnormal; Notable for the following:    Neutro Abs 12.1 (*)    All other components within normal limits  LIPASE, BLOOD    EKG  EKG Interpretation None       Radiology Ct Abdomen Pelvis W Contrast  Result Date: 12/25/2016 CLINICAL DATA:  LEFT lower quadrant abdominal pain, history hypertension and smoking EXAM: CT ABDOMEN AND PELVIS WITH CONTRAST TECHNIQUE: Multidetector CT imaging of the abdomen and pelvis was performed using the standard protocol following bolus administration of intravenous contrast. Sagittal and coronal MPR images reconstructed from axial data set. CONTRAST:  176mL ISOVUE-300 IOPAMIDOL (ISOVUE-300) INJECTION 61% IV. Dilute oral contrast. COMPARISON:  12/09/2012 FINDINGS: Lower chest: Question ground-glass infiltrate versus non solid opacity at the LEFT lower lobe image 1 measuring 15 mm diameter. Remaining lung bases clear. Hepatobiliary: Gallbladder and liver normal appearance Pancreas: Normal appearance Spleen: Normal appearance Adrenals/Urinary Tract: Small BILATERAL renal cysts. Adrenal glands, kidneys, ureters, and bladder otherwise normal appearance. Stomach/Bowel: Normal appendix. Diverticulosis of descending and sigmoid colon with mild wall thickening and subtle infiltrative changes of sigmoid mesocolon and pericolic fat at the descending sigmoid junction suspicious for subtle diverticulitis. No evidence of perforation or abscess. Stomach and remaining bowel loops unremarkable. Vascular/Lymphatic: Normal appearance Reproductive: Prostatic enlargement gland 6.3 x 4.7 cm image 84. Unremarkable seminal vesicles Other: Small BILATERAL inguinal hernias containing fat. No free air or free fluid. Small ventral fascial defects supraumbilical and at umbilicus on sagittal view without bowel  herniation. Musculoskeletal: No acute osseous findings. IMPRESSION: Descending and sigmoid colonic diverticulosis with subtle acute diverticulitis changes at the descending sigmoid junction. Small BILATERAL inguinal hernias containing fat. Prostatic enlargement. Question ground-glass infiltrate versus non solid opacity LEFT lower lobe 15 mm in diameter ; followup CT chest recommended in 3 months to determine persistence and exclude non solid neoplasm. Electronically Signed   By: Lavonia Dana M.D.   On: 12/25/2016 15:26    Procedures Procedures (including critical care time)  Medications Ordered in ED Medications  HYDROcodone-acetaminophen (NORCO/VICODIN) 5-325 MG per tablet 1 tablet (not administered)  sodium chloride 0.9 % bolus 1,000 mL (0 mLs Intravenous Stopped 12/25/16 1634)  iopamidol (ISOVUE-300) 61 % injection (100 mLs  Contrast Given 12/25/16 1503)  ciprofloxacin (CIPRO) tablet 500 mg (500 mg Oral Given 12/25/16 1640)  metroNIDAZOLE (FLAGYL) tablet 500 mg (500 mg Oral Given 12/25/16 1640)  acetaminophen (TYLENOL) tablet 650 mg (650 mg Oral Given 12/25/16 1640)     Initial Impression / Assessment and Plan / ED Course  I have reviewed the triage vital signs and the nursing notes.  Pertinent labs & imaging results that were available during my care of the  patient were reviewed by me and considered in my medical decision making (see chart for details).     56 y.o. male here with LLQ abd pain and nausea x3 weeks, had issues with abd cramping in the past and was worked up by Dr. Paulita Fujita many years ago, had negative CT scans and EGD, and Colonoscopies that only revealed polyps; was presumptively diagnosed with IBS vs colitis, prescribed bentyl. Has not had much issue for the last 45yrs but ~3wks ago started having return of symptoms. On exam, mild LLQ TTP, mildly hypoactive BS throughout, mild distension vs body habitus, ventral hernia scar noted, nonperitoneal exam. Lipase WNL, CMP WNL, CBC with  WBC 15.2, will add-on differential. Awaiting U/A. Will get CT abd/pelv to eval for perf/obstruction/diveriticulitis/etc. Pt declines wanting anything for pain or nausea at this time. Will give fluids and reassess shortly  4:14 PM Differential showing slight neutrophilic predominance. U/A in process. CT shows diverticulosis with small amount of diverticulitis at descending sigmoid junction, small b/l inguinal hernias containing fat, mild prostatic enlargement, and questionable area on LLL which recommends CT chest in 3 months to further evaluate. Will give cipro/flagyl, pt requesting tylenol for pain now. Will reassess after meds given and U/A results, but I anticipate we can d/c home with cipro/flagyl x1wk, advised tylenol/motrin for pain but will rx norco for severe pain; and rx zofran (of note, EKG from 09/2014 without any prolonged QT), discussed increase water and fiber in diet, and f/up with his GI specialist in 1-2wks for recheck of symptoms. Will reassess shortly.   5:46 PM U/A unremarkable. Pt feeling somewhat better, requesting pain pill before he leaves which is reasonable; pt tolerating PO well. Will d/c home with previously outlined plan and medication rx's, f/up with GI in 1-2wks for recheck. [place] reviewed prior to dispensing controlled substance medications, and was notable for: no controlled substances in last 102yr. Risks/benefits/alternatives and expectations discussed regarding controlled substances. Side effects of medications discussed. Informed consent obtained.   Also advised PCP f/up in 1-2wks as well to discuss followup CT chest for the abnormality found today. I explained the diagnosis and have given explicit precautions to return to the ER including for any other new or worsening symptoms. The patient understands and accepts the medical plan as it's been dictated and I have answered their questions. Discharge instructions concerning home care and prescriptions have been  given. The patient is STABLE and is discharged to home in good condition.   Final Clinical Impressions(s) / ED Diagnoses   Final diagnoses:  LLQ abdominal pain  Nausea  Neutrophilic leukocytosis  Diverticulitis of large intestine without perforation or abscess, unspecified bleeding status  Incidental lung nodule  Bilateral inguinal hernia without obstruction or gangrene, recurrence not specified  Enlarged prostate    New Prescriptions New Prescriptions   CIPROFLOXACIN (CIPRO) 500 MG TABLET    Take 1 tablet (500 mg total) by mouth 2 (two) times daily. One po bid x 7 days   HYDROCODONE-ACETAMINOPHEN (NORCO) 5-325 MG TABLET    Take 1 tablet by mouth every 6 (six) hours as needed for severe pain.   METRONIDAZOLE (FLAGYL) 500 MG TABLET    Take 1 tablet (500 mg total) by mouth 3 (three) times daily.   ONDANSETRON (ZOFRAN ODT) 4 MG DISINTEGRATING TABLET    Take 1 tablet (4 mg total) by mouth every 8 (eight) hours as needed for nausea or vomiting.     [address], PA-C 12/25/16 [place], DO  12/28/16 0706  

## 2017-01-08 DIAGNOSIS — Z86010 Personal history of colonic polyps: Secondary | ICD-10-CM | POA: Diagnosis not present

## 2017-01-08 DIAGNOSIS — K5732 Diverticulitis of large intestine without perforation or abscess without bleeding: Secondary | ICD-10-CM | POA: Diagnosis not present

## 2017-01-08 DIAGNOSIS — K580 Irritable bowel syndrome with diarrhea: Secondary | ICD-10-CM | POA: Diagnosis not present

## 2017-01-24 ENCOUNTER — Other Ambulatory Visit (HOSPITAL_BASED_OUTPATIENT_CLINIC_OR_DEPARTMENT_OTHER): Payer: Self-pay | Admitting: Gastroenterology

## 2017-01-24 ENCOUNTER — Other Ambulatory Visit: Payer: Self-pay | Admitting: Gastroenterology

## 2017-01-24 DIAGNOSIS — R1084 Generalized abdominal pain: Secondary | ICD-10-CM

## 2017-01-25 ENCOUNTER — Ambulatory Visit (HOSPITAL_BASED_OUTPATIENT_CLINIC_OR_DEPARTMENT_OTHER)
Admission: RE | Admit: 2017-01-25 | Discharge: 2017-01-25 | Disposition: A | Discharge status: Home / Self Care | Payer: 59 | Source: Ambulatory Visit | Attending: Gastroenterology | Admitting: Gastroenterology

## 2017-01-25 ENCOUNTER — Encounter (HOSPITAL_BASED_OUTPATIENT_CLINIC_OR_DEPARTMENT_OTHER): Payer: Self-pay

## 2017-01-25 DIAGNOSIS — R1084 Generalized abdominal pain: Secondary | ICD-10-CM

## 2017-01-25 DIAGNOSIS — K5732 Diverticulitis of large intestine without perforation or abscess without bleeding: Secondary | ICD-10-CM | POA: Insufficient documentation

## 2017-01-25 DIAGNOSIS — K5730 Diverticulosis of large intestine without perforation or abscess without bleeding: Secondary | ICD-10-CM | POA: Insufficient documentation

## 2017-01-25 MED ORDER — IOPAMIDOL (ISOVUE-300) INJECTION 61%
100.0000 mL | Freq: Once | INTRAVENOUS | Status: AC | PRN
  Administered 2017-01-25: 100 mL via INTRAVENOUS

## 2017-02-28 ENCOUNTER — Emergency Department (HOSPITAL_BASED_OUTPATIENT_CLINIC_OR_DEPARTMENT_OTHER): Payer: 59

## 2017-02-28 ENCOUNTER — Inpatient Hospital Stay (HOSPITAL_BASED_OUTPATIENT_CLINIC_OR_DEPARTMENT_OTHER)
Admission: EM | Admit: 2017-02-28 | Discharge: 2017-03-05 | DRG: 872 | Disposition: A | Discharge status: Home / Self Care | Payer: 59 | Attending: Internal Medicine | Admitting: Internal Medicine

## 2017-02-28 ENCOUNTER — Encounter (HOSPITAL_BASED_OUTPATIENT_CLINIC_OR_DEPARTMENT_OTHER): Payer: Self-pay | Admitting: *Deleted

## 2017-02-28 DIAGNOSIS — E785 Hyperlipidemia, unspecified: Secondary | ICD-10-CM | POA: Diagnosis present

## 2017-02-28 DIAGNOSIS — R140 Abdominal distension (gaseous): Secondary | ICD-10-CM | POA: Diagnosis not present

## 2017-02-28 DIAGNOSIS — K5790 Diverticulosis of intestine, part unspecified, without perforation or abscess without bleeding: Secondary | ICD-10-CM | POA: Diagnosis not present

## 2017-02-28 DIAGNOSIS — R109 Unspecified abdominal pain: Secondary | ICD-10-CM

## 2017-02-28 DIAGNOSIS — R7881 Bacteremia: Secondary | ICD-10-CM | POA: Diagnosis not present

## 2017-02-28 DIAGNOSIS — R911 Solitary pulmonary nodule: Secondary | ICD-10-CM | POA: Diagnosis present

## 2017-02-28 DIAGNOSIS — E872 Acidosis: Secondary | ICD-10-CM | POA: Diagnosis present

## 2017-02-28 DIAGNOSIS — R1032 Left lower quadrant pain: Secondary | ICD-10-CM | POA: Diagnosis not present

## 2017-02-28 DIAGNOSIS — K5732 Diverticulitis of large intestine without perforation or abscess without bleeding: Secondary | ICD-10-CM | POA: Diagnosis not present

## 2017-02-28 DIAGNOSIS — F17200 Nicotine dependence, unspecified, uncomplicated: Secondary | ICD-10-CM | POA: Diagnosis present

## 2017-02-28 DIAGNOSIS — Z79899 Other long term (current) drug therapy: Secondary | ICD-10-CM | POA: Diagnosis not present

## 2017-02-28 DIAGNOSIS — R197 Diarrhea, unspecified: Secondary | ICD-10-CM | POA: Diagnosis not present

## 2017-02-28 DIAGNOSIS — F17210 Nicotine dependence, cigarettes, uncomplicated: Secondary | ICD-10-CM | POA: Diagnosis present

## 2017-02-28 DIAGNOSIS — I10 Essential (primary) hypertension: Secondary | ICD-10-CM | POA: Diagnosis present

## 2017-02-28 DIAGNOSIS — A419 Sepsis, unspecified organism: Principal | ICD-10-CM | POA: Diagnosis present

## 2017-02-28 DIAGNOSIS — R509 Fever, unspecified: Secondary | ICD-10-CM | POA: Diagnosis not present

## 2017-02-28 DIAGNOSIS — K5792 Diverticulitis of intestine, part unspecified, without perforation or abscess without bleeding: Secondary | ICD-10-CM | POA: Diagnosis present

## 2017-02-28 HISTORY — DX: Diverticulitis of intestine, part unspecified, without perforation or abscess without bleeding: K57.92

## 2017-02-28 HISTORY — DX: Hyperlipidemia, unspecified: E78.5

## 2017-02-28 LAB — I-STAT CG4 LACTIC ACID, ED
Lactic Acid, Venous: 5.8 mmol/L (ref 0.5–1.9)
Lactic Acid, Venous: 2.22 mmol/L (ref 0.5–1.9)

## 2017-02-28 LAB — CBC WITH DIFFERENTIAL/PLATELET
Basophils Absolute: 0 10*3/uL (ref 0.0–0.1)
Basophils Relative: 0 %
Eosinophils Absolute: 0 10*3/uL (ref 0.0–0.7)
Eosinophils Relative: 0 %
HCT: 51.6 % (ref 39.0–52.0)
Hemoglobin: 18 g/dL — ABNORMAL HIGH (ref 13.0–17.0)
Lymphocytes Relative: 8 %
Lymphs Abs: 1.2 10*3/uL (ref 0.7–4.0)
MCH: 30.8 pg (ref 26.0–34.0)
MCHC: 34.9 g/dL (ref 30.0–36.0)
MCV: 88.4 fL (ref 78.0–100.0)
Monocytes Absolute: 0.2 10*3/uL (ref 0.1–1.0)
Monocytes Relative: 1 %
Neutro Abs: 14.8 10*3/uL — ABNORMAL HIGH (ref 1.7–7.7)
Neutrophils Relative %: 91 %
Platelets: 256 10*3/uL (ref 150–400)
RBC: 5.84 MIL/uL — ABNORMAL HIGH (ref 4.22–5.81)
RDW: 14.6 % (ref 11.5–15.5)
WBC: 16.3 10*3/uL — ABNORMAL HIGH (ref 4.0–10.5)

## 2017-02-28 LAB — URINALYSIS, COMPLETE (UACMP) WITH MICROSCOPIC
Bacteria, UA: NONE SEEN
Bilirubin Urine: NEGATIVE
Glucose, UA: NEGATIVE mg/dL
Hgb urine dipstick: NEGATIVE
Ketones, ur: NEGATIVE mg/dL
Leukocytes, UA: NEGATIVE
Nitrite: NEGATIVE
Protein, ur: NEGATIVE mg/dL
Specific Gravity, Urine: 1.008 (ref 1.005–1.030)
Squamous Epithelial / LPF: NONE SEEN
WBC, UA: NONE SEEN WBC/hpf (ref 0–5)
pH: 7 (ref 5.0–8.0)

## 2017-02-28 LAB — COMPREHENSIVE METABOLIC PANEL
ALT: 42 U/L (ref 17–63)
AST: 39 U/L (ref 15–41)
Albumin: 4.2 g/dL (ref 3.5–5.0)
Alkaline Phosphatase: 63 U/L (ref 38–126)
Anion gap: 13 (ref 5–15)
BUN: 12 mg/dL (ref 6–20)
CO2: 20 mmol/L — ABNORMAL LOW (ref 22–32)
Calcium: 9 mg/dL (ref 8.9–10.3)
Chloride: 101 mmol/L (ref 101–111)
Creatinine, Ser: 1.14 mg/dL (ref 0.61–1.24)
GFR calc Af Amer: >60 mL/min (ref >60)
GFR calc non Af Amer: >60 mL/min (ref >60)
Glucose, Bld: 95 mg/dL (ref 65–99)
Potassium: 3.8 mmol/L (ref 3.5–5.1)
Sodium: 134 mmol/L — ABNORMAL LOW (ref 135–145)
Total Bilirubin: 1.2 mg/dL (ref 0.3–1.2)
Total Protein: 7.4 g/dL (ref 6.5–8.1)

## 2017-02-28 LAB — BRAIN NATRIURETIC PEPTIDE: B Natriuretic Peptide: 15.3 pg/mL (ref 0.0–100.0)

## 2017-02-28 LAB — LIPASE, BLOOD: Lipase: 24 U/L (ref 11–51)

## 2017-02-28 MED ORDER — DEXTROSE 5 % IV SOLN
2.0000 g | Freq: Once | INTRAVENOUS | Status: AC
  Administered 2017-02-28: 2 g via INTRAVENOUS
  Filled 2017-02-28: qty 2

## 2017-02-28 MED ORDER — ACETAMINOPHEN 500 MG PO TABS
1000.0000 mg | ORAL_TABLET | Freq: Once | ORAL | Status: AC
  Administered 2017-02-28: 1000 mg via ORAL
  Filled 2017-02-28: qty 2

## 2017-02-28 MED ORDER — SODIUM CHLORIDE 0.9 % IV SOLN
INTRAVENOUS | Status: DC
  Administered 2017-02-28 – 2017-03-02 (×4): via INTRAVENOUS
  Administered 2017-03-03 (×2): 1000 mL via INTRAVENOUS
  Administered 2017-03-04: 13:00:00 via INTRAVENOUS

## 2017-02-28 MED ORDER — ACETAMINOPHEN 650 MG RE SUPP: 650.0000 mg | Freq: Four times a day (QID) | RECTAL | Status: DC | PRN

## 2017-02-28 MED ORDER — NICOTINE 21 MG/24HR TD PT24
MEDICATED_PATCH | TRANSDERMAL | Status: AC
  Administered 2017-02-28: 21 mg
  Filled 2017-02-28: qty 1

## 2017-02-28 MED ORDER — SODIUM CHLORIDE 0.9 % IV BOLUS (SEPSIS): 1000.0000 mL | Freq: Once | INTRAVENOUS | Status: DC

## 2017-02-28 MED ORDER — VANCOMYCIN HCL 500 MG IV SOLR
INTRAVENOUS | Status: AC
  Administered 2017-02-28: 2000 mg
  Filled 2017-02-28: qty 2000

## 2017-02-28 MED ORDER — ONDANSETRON HCL 4 MG PO TABS: 4.0000 mg | ORAL_TABLET | Freq: Four times a day (QID) | ORAL | Status: DC | PRN

## 2017-02-28 MED ORDER — SODIUM CHLORIDE 0.9 % IV BOLUS (SEPSIS)
1000.0000 mL | Freq: Once | INTRAVENOUS | Status: AC
  Administered 2017-02-28: 1000 mL via INTRAVENOUS

## 2017-02-28 MED ORDER — IOPAMIDOL (ISOVUE-300) INJECTION 61%
100.0000 mL | Freq: Once | INTRAVENOUS | Status: AC | PRN
  Administered 2017-02-28: 100 mL via INTRAVENOUS

## 2017-02-28 MED ORDER — MORPHINE SULFATE (PF) 4 MG/ML IV SOLN
2.0000 mg | INTRAVENOUS | Status: DC | PRN
  Administered 2017-03-01 (×2): 2 mg via INTRAVENOUS
  Filled 2017-02-28 (×2): qty 1

## 2017-02-28 MED ORDER — ACETAMINOPHEN 325 MG PO TABS
650.0000 mg | ORAL_TABLET | Freq: Four times a day (QID) | ORAL | Status: DC | PRN
  Administered 2017-03-01 (×2): 650 mg via ORAL
  Filled 2017-02-28 (×2): qty 2

## 2017-02-28 MED ORDER — HYDROMORPHONE HCL 1 MG/ML IJ SOLN
1.0000 mg | Freq: Once | INTRAMUSCULAR | Status: AC
  Administered 2017-02-28: 1 mg via INTRAVENOUS
  Filled 2017-02-28: qty 1

## 2017-02-28 MED ORDER — SODIUM CHLORIDE 0.9% FLUSH
3.0000 mL | Freq: Two times a day (BID) | INTRAVENOUS | Status: DC
  Administered 2017-03-01 – 2017-03-04 (×4): 3 mL via INTRAVENOUS

## 2017-02-28 MED ORDER — ONDANSETRON HCL 4 MG/2ML IJ SOLN
4.0000 mg | Freq: Four times a day (QID) | INTRAMUSCULAR | Status: DC | PRN
  Administered 2017-03-01: 4 mg via INTRAVENOUS
  Filled 2017-02-28: qty 2

## 2017-02-28 MED ORDER — VANCOMYCIN HCL 10 G IV SOLR
1250.0000 mg | Freq: Two times a day (BID) | INTRAVENOUS | Status: DC
  Filled 2017-02-28: qty 1250

## 2017-02-28 MED ORDER — VANCOMYCIN HCL 10 G IV SOLR
2000.0000 mg | Freq: Once | INTRAVENOUS | Status: DC
  Filled 2017-02-28: qty 2000

## 2017-02-28 MED ORDER — ENOXAPARIN SODIUM 40 MG/0.4ML ~~LOC~~ SOLN
40.0000 mg | Freq: Every day | SUBCUTANEOUS | Status: DC
Start: 2017-02-28 — End: 2017-03-05
  Administered 2017-03-01 – 2017-03-04 (×5): 40 mg via SUBCUTANEOUS
  Filled 2017-02-28 (×5): qty 0.4

## 2017-02-28 MED ORDER — DEXTROSE 5 % IV SOLN
2.0000 g | Freq: Three times a day (TID) | INTRAVENOUS | Status: DC
  Filled 2017-02-28 (×3): qty 2

## 2017-02-28 MED ORDER — NICOTINE 14 MG/24HR TD PT24
14.0000 mg | MEDICATED_PATCH | Freq: Once | TRANSDERMAL | Status: DC
  Filled 2017-02-28: qty 1

## 2017-02-28 MED ORDER — LEVOFLOXACIN IN D5W 750 MG/150ML IV SOLN: 750.0000 mg | INTRAVENOUS | Status: DC

## 2017-02-28 MED ORDER — LEVOFLOXACIN IN D5W 750 MG/150ML IV SOLN
750.0000 mg | Freq: Once | INTRAVENOUS | Status: DC
  Administered 2017-02-28: 750 mg via INTRAVENOUS
  Filled 2017-02-28: qty 150

## 2017-02-28 MED ORDER — VANCOMYCIN HCL IN DEXTROSE 1-5 GM/200ML-% IV SOLN: 1000.0000 mg | Freq: Once | INTRAVENOUS | Status: DC

## 2017-02-28 MED ORDER — AZTREONAM 1 G IJ SOLR
INTRAMUSCULAR | Status: AC
Start: 2017-02-28 — End: 2017-02-28
  Filled 2017-02-28: qty 1

## 2017-02-28 MED ORDER — SODIUM CHLORIDE 0.9 % IV BOLUS (SEPSIS): 500.0000 mL | Freq: Once | INTRAVENOUS | Status: DC

## 2017-02-28 NOTE — ED Provider Notes (Signed)
[facility] Provider Note   CSN: [id number] Arrival date & time: 02/28/17  1425     History   Chief Complaint Chief Complaint  Patient presents with  . Abdominal Pain    HPI Dakota Santos is a 56 y.o. male.  Patient presents with left lower quadrant abdominal pain. Patient's had pain in that area for proximally 6 years. In times in the past has had diverticulitis. CT scan in February was consistent with that. But negative in March. Patient's had 2 rounds of Cipro and Flagyl the past 2 months with no improvement. Not currently on antibiotics. Last past 2 days his had fever and started with chills today. Some nausea no vomiting no diarrhea. No blood in the bowel movements.      Past Medical History:  Diagnosis Date  . Diverticulitis   . Hypertension     Patient Active Problem List   Diagnosis Date Noted  . Sepsis ([place]) 02/28/2017    Past Surgical History:  Procedure Laterality Date  . HERNIA REPAIR    . ORIF ELBOW FRACTURE Left 09/18/2014   Procedure: OPEN REDUCTION INTERNAL FIXATION (ORIF) ELBOW/OLECRANON FRACTURE LEFT;  Surgeon: Nita Sells, MD;  Location: [facility];  Service: Orthopedics;  Laterality: Left;       Home Medications    Prior to Admission medications   Medication Sig Start Date End Date Taking? Authorizing Provider  HYDROcodone-acetaminophen (NORCO) 5-325 MG tablet Take 1 tablet by mouth every 6 (six) hours as needed for severe pain. 12/25/16  Yes Rosalia, PA-C  LISINOPRIL PO Take by mouth.   Yes Historical Provider, MD  lisinopril-hydrochlorothiazide (PRINZIDE,ZESTORETIC) 20-25 MG per tablet Take 1 tablet by mouth daily.    Historical Provider, MD  ondansetron (ZOFRAN ODT) 4 MG disintegrating tablet Take 1 tablet (4 mg total) by mouth every 8 (eight) hours as needed for nausea or vomiting. 12/25/16   [place], PA-C  oxyCODONE-acetaminophen (PERCOCET/ROXICET) 5-325 MG per tablet 1 to 2 tabs PO q6hrs  PRN for pain  09/18/14   Loni Dolly, PA-C  promethazine (PHENERGAN) 25 MG tablet Take 1 tablet (25 mg total) by mouth every 6 (six) hours as needed for nausea or vomiting. 09/17/14   Elmyra Ricks Pisciot[place], PA-C    Family History No family history on file.  Social History Social History  Substance Use Topics  . Smoking status: Current Every Day Smoker    Packs/day: 0.50    Types: Cigarettes  . Smokeless tobacco: Never Used  . Alcohol use Yes     Comment: couple beers once a month     Allergies   Penicillins   Review of Systems Review of Systems  Constitutional: Positive for chills and fever.  HENT: Negative for congestion.   Eyes: Negative for visual disturbance.  Respiratory: Negative for shortness of breath.   Cardiovascular: Negative for chest pain.  Gastrointestinal: Positive for abdominal pain and nausea. Negative for diarrhea and vomiting.  Genitourinary: Negative for dysuria.  Musculoskeletal: Positive for back pain.  Skin: Negative for rash.  Neurological: Negative for headaches.  Hematological: Does not bruise/bleed easily.  Psychiatric/Behavioral: Negative for confusion.     Physical Exam Updated Vital Signs BP 110/64   Pulse 99   Temp 99.6 F (37.6 C) (Oral)   Resp 20   Ht '5\' 10"'  (1.778 m)   Wt 95.3 kg   SpO2 96%   BMI 30.13 kg/m   Physical Exam  Constitutional: He is oriented to person, place, and time. He appears well-developed  and well-nourished. No distress.  HENT:  Head: Normocephalic and atraumatic.  Mouth/Throat: Oropharynx is clear and moist.  Eyes: EOM are normal. Pupils are equal, round, and reactive to light.  Neck: Normal range of motion. Neck supple.  Cardiovascular: Normal rate, regular rhythm and normal heart sounds.   Pulmonary/Chest: Effort normal and breath sounds normal. No respiratory distress.  Abdominal: Soft. Bowel sounds are normal. There is tenderness.  Left lower quadrant no guarding  Musculoskeletal: Normal range of motion. He exhibits  no edema.  Neurological: He is alert and oriented to person, place, and time. No cranial nerve deficit or sensory deficit. He exhibits normal muscle tone. Coordination normal.  Skin: Skin is warm.  Nursing note and vitals reviewed.    ED Treatments / Results  Labs (all labs ordered are listed, but only abnormal results are displayed) Labs Reviewed  COMPREHENSIVE METABOLIC PANEL - Abnormal; Notable for the following:       Result Value   Sodium 134 (*)    CO2 20 (*)    All other components within normal limits  CBC WITH DIFFERENTIAL/PLATELET - Abnormal; Notable for the following:    WBC 16.3 (*)    RBC 5.84 (*)    Hemoglobin 18.0 (*)    Neutro Abs 14.8 (*)    All other components within normal limits  I-STAT CG4 LACTIC ACID, ED - Abnormal; Notable for the following:    Lactic Acid, Venous 5.80 (*)    All other components within normal limits  I-STAT CG4 LACTIC ACID, ED - Abnormal; Notable for the following:    Lactic Acid, Venous 2.22 (*)    All other components within normal limits  CULTURE, BLOOD (ROUTINE X 2)  CULTURE, BLOOD (ROUTINE X 2)  URINE CULTURE  LIPASE, BLOOD  BRAIN NATRIURETIC PEPTIDE  URINALYSIS, COMPLETE (UACMP) WITH MICROSCOPIC    EKG  EKG Interpretation  Date/Time:  Thursday February 28 2017 14:48:42 EDT Ventricular Rate:  114 PR Interval:    QRS Duration: 84 QT Interval:  343 QTC Calculation: 473 R Axis:   68 Text Interpretation:  Sinus tachycardia Low voltage, precordial leads Confirmed by ZACKOWSKI  MD, SCOTT ([id number]) on 02/28/2017 3:34:16 PM       Radiology Ct Abdomen Pelvis W Contrast  Result Date: 02/28/2017 CLINICAL DATA:  Intermittent lower abdominal pain for several years, worse since March 2018. Chills, fever and nausea today. EXAM: CT ABDOMEN AND PELVIS WITH CONTRAST TECHNIQUE: Multidetector CT imaging of the abdomen and pelvis was performed using the standard protocol following bolus administration of intravenous contrast. CONTRAST:  142m  ISOVUE-300 IOPAMIDOL (ISOVUE-300) INJECTION 61% COMPARISON:  Previous examinations, the most recent dated 01/25/2017. FINDINGS: Lower chest: A partially included sub solid nodular density is demonstrated in the left lower lobe on the first image. This measures 1.3 cm in maximum diameter and measured 1.5 cm in maximum diameter on 12/25/2016, without significant change in appearance. Hepatobiliary: Mild diffuse low density of the liver relative to the spleen. Normal appearing gallbladder. Pancreas: Unremarkable. No pancreatic ductal dilatation or surrounding inflammatory changes. Spleen: Normal in size without focal abnormality. Adrenals/Urinary Tract: Adrenal glands are unremarkable. Small bilateral renal cysts without renal calculi or hydronephrosis. Bladder is unremarkable. Stomach/Bowel: Multiple sigmoid colon diverticula. Pericolonic soft tissue stranding adjacent to some of the diverticula in the proximal sigmoid colon. No fluid collections or free peritoneal air. Unremarkable stomach, small bowel and appendix. Vascular/Lymphatic: Minimal atheromatous arterial calcifications. No enlarged lymph nodes. Reproductive: Mildly enlarged prostate gland. Other: Small bilateral inguinal hernias  containing fat. Tiny umbilical hernia containing fat. Musculoskeletal: Lumbar and lower thoracic spine degenerative changes. IMPRESSION: 1. Sigmoid colon diverticulosis and proximal diverticulitis without abscess. 2. Mild diffuse hepatic steatosis. 3. Grossly stable partially included left lower lobe subsolid nodule. A follow-up chest CT without contrast is recommended in 3 months. Electronically Signed   By: Claudie Revering M.D.   On: 02/28/2017 18:35   Dg Chest Port 1 View  Result Date: 02/28/2017 CLINICAL DATA:  Fever and chills. EXAM: PORTABLE CHEST 1 VIEW COMPARISON:  None. FINDINGS: 1558 hours. Lungs are hyperexpanded. The lungs are clear wiithout focal pneumonia, edema, pneumothorax or pleural effusion. The  cardiopericardial silhouette is within normal limits for size. The visualized bony structures of the thorax are intact. Telemetry leads overlie the chest. IMPRESSION: Hyperexpansion without acute cardiopulmonary findings. Electronically Signed   By: Misty Stanley M.D.   On: 02/28/2017 16:24    Procedures Procedures (including critical care time)  CRITICAL CARE Performed by: Fredia Sorrow Total critical care time: 45 minutes Critical care time was exclusive of separately billable procedures and treating other patients. Critical care was necessary to treat or prevent imminent or life-threatening deterioration. Critical care was time spent personally by me on the following activities: development of treatment plan with patient and/or surrogate as well as nursing, discussions with consultants, evaluation of patient's response to treatment, examination of patient, obtaining history from patient or surrogate, ordering and performing treatments and interventions, ordering and review of laboratory studies, ordering and review of radiographic studies, pulse oximetry and re-evaluation of patient's condition.  Medications Ordered in ED Medications  0.9 %  sodium chloride infusion ( Intravenous Not Given 02/28/17 1827)  vancomycin (VANCOCIN) 2,000 mg in sodium chloride 0.9 % 500 mL IVPB (2,000 mg Intravenous Not Given 02/28/17 1750)  sodium chloride 0.9 % bolus 1,000 mL (1,000 mLs Intravenous Not Given 02/28/17 1827)    And  sodium chloride 0.9 % bolus 1,000 mL (0 mLs Intravenous Stopped 02/28/17 1747)    And  sodium chloride 0.9 % bolus 1,000 mL (0 mLs Intravenous Stopped 02/28/17 1828)  nicotine (NICODERM CQ - dosed in mg/24 hours) patch 14 mg (14 mg Transdermal Not Given 02/28/17 1748)  aztreonam (AZACTAM) 2 g in dextrose 5 % 50 mL IVPB (0 g Intravenous Stopped 02/28/17 1832)  levofloxacin (LEVAQUIN) IVPB 750 mg (0 mg Intravenous Stopped 02/28/17 1833)  aztreonam (AZACTAM) 2 g in dextrose 5 % 50 mL IVPB (0  g Intravenous Stopped 02/28/17 1746)  sodium chloride 0.9 % bolus 1,000 mL (0 mLs Intravenous Stopped 02/28/17 1826)  acetaminophen (TYLENOL) tablet 1,000 mg (1,000 mg Oral Given 02/28/17 1605)  HYDROmorphone (DILAUDID) injection 1 mg (1 mg Intravenous Given 02/28/17 1606)  aztreonam (AZACTAM) 1 g injection (  Return to [facility] 02/28/17 1642)  nicotine (NICODERM CQ - dosed in mg/24 hours) 21 mg/24hr patch (21 mg  Patch Applied 02/28/17 1719)  vancomycin (VANCOCIN) 500 MG powder (2,000 mg  Given 02/28/17 1736)  iopamidol (ISOVUE-300) 61 % injection 100 mL (100 mLs Intravenous Contrast Given 02/28/17 1809)     Initial Impression / Assessment and Plan / ED Course  I have reviewed the triage vital signs and the nursing notes.  Pertinent labs & imaging results that were available during my care of the patient were reviewed by me and considered in my medical decision making (see chart for details).    Patient followed by Dr. Paulita Fujita from GI medicine. Patient has had a history of diverticulitis in the past. He's had persistent left  lower quadrant abdominal pain in March had a CT scan of the abdomen which was negative for any acute findings including diverticulitis. Patient presents today with a 2 day history of some fevers worsening abdominal pain. Upon arrival here had fairly significant chills. Tachycardic. Met sepsis criteria. Sepsis orders completed for unknown source at that time which included vancomycin and Zosyn. Also received approximately 3 L fluid challenge. With that his lactic acid came down from 5.08 down now to 2.2  Patient looking and feeling much better. Heart rate is now below down 100. Never had any hypotensive.  CT scan does confirm diverticulitis without any complicating factors.  Chest x-ray negative urinalysis negative. Did have a leukocytosis.  Patient will be admitted to step down unit at [facility] long. Discussed with Dr. Karmen Bongo.    Final Clinical Impressions(s) / ED  Diagnoses   Final diagnoses:  Sepsis, due to unspecified organism [facility])  Diverticulitis of large intestine without perforation or abscess without bleeding    New Prescriptions New Prescriptions   No medications on file     Fredia Sorrow, MD 02/28/17 2005

## 2017-02-28 NOTE — Progress Notes (Signed)
Called for this 56yo male with h/o diverticulitis followed by Dr. Paulita Fujita.  Abdominal pain, fever, chills.  Met sepsis crtieria, lactate 5+.  No abscess or complicating factors.  Repeat lactate is 2.2.  Dr. Rogene Houston assures me that he looks tremendously better at this time.  Likely still needs SDU monitoring.  Received Zosyn/Vanc (has already completed 2 rounds of Cipro/Flagyl over the past 2 months).  Will accept to [facility] SDU, inpatient status.  Carlyon Shadow, M.D.

## 2017-02-28 NOTE — ED Triage Notes (Signed)
Abdominal pain on and off for 6 years. States he has a hx of diverticulitis. He has had 2 rounds of Cipro and Flagyl over the past 2 months with no improvement in the pain. Chills, fever. He took a Vicodin about an hour ago.

## 2017-02-28 NOTE — Progress Notes (Signed)
Pharmacy Antibiotic Note  Dakota Santos is a 56 y.o. male admitted on 02/28/2017 with abdominal pain. Pt has a history of diverticulitis and has completed courses of cipro/flagyl over the past couple months. SCr 1.1, eCrCl ~ 80 ml/min, LA 5.8, WBC 16.3.    Plan: -Vancomycin 2 g IV x1 then 1250 mg IV q12h -Aztreonam 2 g IV q8h -Levaquin 750 mg IV q24h -Monitor renal fx, cultures, VT as needed    Height: 5\' 10"  (177.8 cm) Weight: 210 lb (95.3 kg) IBW/kg (Calculated) : 73  Temp (24hrs), Avg:99.6 F (37.6 C), Min:99.6 F (37.6 C), Max:99.6 F (37.6 C)  No results for input(s): WBC, CREATININE, LATICACIDVEN, VANCOTROUGH, VANCOPEAK, VANCORANDOM, GENTTROUGH, GENTPEAK, GENTRANDOM, TOBRATROUGH, TOBRAPEAK, TOBRARND, AMIKACINPEAK, AMIKACINTROU, AMIKACIN in the last 168 hours.  CrCl cannot be calculated (Patient's most recent lab result is older than the maximum 21 days allowed.).    Allergies  Allergen Reactions  . Penicillins     rash    Antimicrobials this admission: 4/19 vancomycin > 4/19 aztreonam > 4/19 levaquin >   Dose adjustments this admission: N/A   Microbiology results: 4/19 blood cx: 4/19 urine cx:    [name] 02/28/2017 3:59 PM

## 2017-02-28 NOTE — H&P (Signed)
History and Physical    Nicklaus Alviar PPJ:[id number] DOB: 05-Aug-1961 DOA: 02/28/2017  PCP: [name], MD Consultants:  Paulita Fujita - GI Patient coming from: home - lives with wife; NOK: wife, [phone]  Chief Complaint: abdominal pain  HPI: Dakota Santos is a 56 y.o. male with medical history significant of HTN, HLD, and diverticulitis presenting with abdominal pain.  Patient with h/o LLQ abdominal pain x 6 years.  Diagnosed with diverticulosis/itis in 2/18 at [facility], given Cipro/Flagyl.  Had pain again in March, daignosed with diverticulitis and again given Cipro/Flagyl.  Intermittent pain, worse over the last few days.  Today, he wasn't feeling well when he went to work.  Became feverish, went home around noon and went to [facility].  Fever to 99.5?  +n/v, no blood in emesis.  Vomited only once.  Last BM was this AM and was normal.  Has been contipated for the last week - dry, firm stools.  No urinary symptoms, no respiratory symptoms.  Feels bloated.     ED Course: Significant chills on arrival with tachycardia and lactate to 5.8.  Given Vanc/Zosyn and 3L IVF bolus with improvement in HR, lactate down to 2.2.  CT scan confirms recurrent diverticulitis.  Review of Systems: As per HPI; otherwise review of systems reviewed and negative.   Ambulatory Status:  Ambulates without difficulty  Past Medical History:  Diagnosis Date  . Diverticulitis   . Hyperlipidemia   . Hypertension     Past Surgical History:  Procedure Laterality Date  . HERNIA REPAIR    . ORIF ELBOW FRACTURE Left 09/18/2014   Procedure: OPEN REDUCTION INTERNAL FIXATION (ORIF) ELBOW/OLECRANON FRACTURE LEFT;  Surgeon: Nita Sells, MD;  Location: [place];  Service: Orthopedics;  Laterality: Left;    Social History   Social History  . Marital status: Married    Spouse name: N/A  . Number of children: N/A  . Years of education: N/A   Occupational History  . chief Chief Financial Officer    Social History Main Topics   . Smoking status: Current Every Day Smoker    Packs/day: 0.50    Years: 40.00    Types: Cigarettes  . Smokeless tobacco: Never Used  . Alcohol use Yes     Comment: couple beers once a month  . Drug use: No  . Sexual activity: Not on file   Other Topics Concern  . Not on file   Social History Narrative  . No narrative on file    Allergies  Allergen Reactions  . Penicillins     rash    Family History  Problem Relation Age of Onset  . Atrial fibrillation Mother   . Prostate cancer Father     Prior to Admission medications   Medication Sig Start Date End Date Taking? Authorizing Provider  HYDROcodone-acetaminophen (NORCO) 5-325 MG tablet Take 1 tablet by mouth every 6 (six) hours as needed for severe pain. 12/25/16  Yes [place], PA-C  LISINOPRIL PO Take by mouth.   Yes Historical Provider, MD  lisinopril-hydrochlorothiazide (PRINZIDE,ZESTORETIC) 20-25 MG per tablet Take 1 tablet by mouth daily.    Historical Provider, MD  ondansetron (ZOFRAN ODT) 4 MG disintegrating tablet Take 1 tablet (4 mg total) by mouth every 8 (eight) hours as needed for nausea or vomiting. 12/25/16   [place], PA-C  oxyCODONE-acetaminophen (PERCOCET/ROXICET) 5-325 MG per tablet 1 to 2 tabs PO q6hrs  PRN for pain 09/18/14   Loni Dolly, PA-C  promethazine (PHENERGAN) 25 MG tablet Take 1 tablet (  25 mg total) by mouth every 6 (six) hours as needed for nausea or vomiting. 09/17/14   Monico Blitz, PA-C    Physical Exam: Vitals:   02/28/17 2135 02/28/17 2200 02/28/17 2300 02/28/17 2305  BP: 108/66 102/75 (!) 86/43 (!) 90/47  Pulse: 82 79 79 77  Resp: 15 18 19  (!) 21  Temp:      TempSrc:      SpO2: 96% 95% 96% 98%  Weight:      Height:         General: Appears calm and comfortable and is NAD, pleasant and conversant Eyes:   EOMI, normal lids, iris ENT:  grossly normal hearing, lips & tongue, mmm Neck:  no LAD, masses or thyromegaly Cardiovascular:  RRR, no m/r/g. No LE edema.    Respiratory:  CTA bilaterally, no w/r/r. Normal respiratory effort. Abdomen:  soft, mild-moderate LLQ TTP, ?distended, mildly hypoactive BS Skin:  no rash or induration seen on limited exam Musculoskeletal:  grossly normal tone BUE/BLE, good ROM, no bony abnormality Psychiatric:  grossly normal mood and affect, speech fluent and appropriate, AOx3 Neurologic:  CN 2-12 grossly intact, moves all extremities in coordinated fashion, sensation intact  Labs on Admission: I have personally reviewed following labs and imaging studies  CBC:  Recent Labs Lab 02/28/17 1604  WBC 16.3*  NEUTROABS 14.8*  HGB 18.0*  HCT 51.6  MCV 88.4  PLT 426   Basic Metabolic Panel:  Recent Labs Lab 02/28/17 1604  NA 134*  K 3.8  CL 101  CO2 20*  GLUCOSE 95  BUN 12  CREATININE 1.14  CALCIUM 9.0   GFR: Estimated Creatinine Clearance: 85.8 mL/min (by C-G formula based on SCr of 1.14 mg/dL). Liver Function Tests:  Recent Labs Lab 02/28/17 1604  AST 39  ALT 42  ALKPHOS 63  BILITOT 1.2  PROT 7.4  ALBUMIN 4.2    Recent Labs Lab 02/28/17 1604  LIPASE 24   No results for input(s): AMMONIA in the last 168 hours. Coagulation Profile: No results for input(s): INR, PROTIME in the last 168 hours. Cardiac Enzymes: No results for input(s): CKTOTAL, CKMB, CKMBINDEX, TROPONINI in the last 168 hours. BNP (last 3 results) No results for input(s): PROBNP in the last 8760 hours. HbA1C: No results for input(s): HGBA1C in the last 72 hours. CBG: No results for input(s): GLUCAP in the last 168 hours. Lipid Profile: No results for input(s): CHOL, HDL, LDLCALC, TRIG, CHOLHDL, LDLDIRECT in the last 72 hours. Thyroid Function Tests: No results for input(s): TSH, T4TOTAL, FREET4, T3FREE, THYROIDAB in the last 72 hours. Anemia Panel: No results for input(s): VITAMINB12, FOLATE, FERRITIN, TIBC, IRON, RETICCTPCT in the last 72 hours. Urine analysis:    Component Value Date/Time   COLORURINE YELLOW  02/28/2017 [place] 02/28/2017 1604   LABSPEC 1.008 02/28/2017 1604   PHURINE 7.0 02/28/2017 [place] 02/28/2017 1604   HGBUR NEGATIVE 02/28/2017 [place] 02/28/2017 [place] 02/28/2017 1604   PROTEINUR NEGATIVE 02/28/2017 1604   NITRITE NEGATIVE 02/28/2017 1604   LEUKOCYTESUR NEGATIVE 02/28/2017 1604    Creatinine Clearance: Estimated Creatinine Clearance: 85.8 mL/min (by C-G formula based on SCr of 1.14 mg/dL).  Sepsis Labs: @LABRCNTIP (procalcitonin:4,lacticidven:4) )No results found for this or any previous visit (from the past 240 hour(s)).   Radiological Exams on Admission: Ct Abdomen Pelvis W Contrast  Result Date: 02/28/2017 CLINICAL DATA:  Intermittent lower abdominal pain for several years, worse since March 2018.  Chills, fever and nausea today. EXAM: CT ABDOMEN AND PELVIS WITH CONTRAST TECHNIQUE: Multidetector CT imaging of the abdomen and pelvis was performed using the standard protocol following bolus administration of intravenous contrast. CONTRAST:  175mL ISOVUE-300 IOPAMIDOL (ISOVUE-300) INJECTION 61% COMPARISON:  Previous examinations, the most recent dated 01/25/2017. FINDINGS: Lower chest: A partially included sub solid nodular density is demonstrated in the left lower lobe on the first image. This measures 1.3 cm in maximum diameter and measured 1.5 cm in maximum diameter on 12/25/2016, without significant change in appearance. Hepatobiliary: Mild diffuse low density of the liver relative to the spleen. Normal appearing gallbladder. Pancreas: Unremarkable. No pancreatic ductal dilatation or surrounding inflammatory changes. Spleen: Normal in size without focal abnormality. Adrenals/Urinary Tract: Adrenal glands are unremarkable. Small bilateral renal cysts without renal calculi or hydronephrosis. Bladder is unremarkable. Stomach/Bowel: Multiple sigmoid colon diverticula. Pericolonic soft tissue stranding  adjacent to some of the diverticula in the proximal sigmoid colon. No fluid collections or free peritoneal air. Unremarkable stomach, small bowel and appendix. Vascular/Lymphatic: Minimal atheromatous arterial calcifications. No enlarged lymph nodes. Reproductive: Mildly enlarged prostate gland. Other: Small bilateral inguinal hernias containing fat. Tiny umbilical hernia containing fat. Musculoskeletal: Lumbar and lower thoracic spine degenerative changes. IMPRESSION: 1. Sigmoid colon diverticulosis and proximal diverticulitis without abscess. 2. Mild diffuse hepatic steatosis. 3. Grossly stable partially included left lower lobe subsolid nodule. A follow-up chest CT without contrast is recommended in 3 months. Electronically Signed   By: Claudie Revering M.D.   On: 02/28/2017 18:35   Dg Chest Port 1 View  Result Date: 02/28/2017 CLINICAL DATA:  Fever and chills. EXAM: PORTABLE CHEST 1 VIEW COMPARISON:  None. FINDINGS: 1558 hours. Lungs are hyperexpanded. The lungs are clear wiithout focal pneumonia, edema, pneumothorax or pleural effusion. The cardiopericardial silhouette is within normal limits for size. The visualized bony structures of the thorax are intact. Telemetry leads overlie the chest. IMPRESSION: Hyperexpansion without acute cardiopulmonary findings. Electronically Signed   By: Misty Stanley M.D.   On: 02/28/2017 16:24    EKG: Independently reviewed.  Sinus tachycardia with rate 114; rate related changes with no evidence of acute ischemia  Assessment/Plan Principal Problem:   Sepsis ([place]) Active Problems:   Diverticulitis   Lung nodule seen on imaging study   Essential hypertension   Hyperlipidemia   Tobacco dependence   Sepsis due to diverticulitis -Elevated WBC count (16.3), tachycardia with elevated lactate to 5.8 and borderline hypotension -While awaiting blood cultures, this appears to be a preseptic condition. -Sepsis protocol initiated; patient received all of IVF bolus at [facility]    -Following bolus, tachycardia improved and lactate did as well (2.22) -Patient with LLQ abdominal pain (acute on chronic) and findings c/w diverticulitis despite 2 recent courses of Cipro/Flagyl -Will treat with Vanc/Aztreonam/Levaquin as per pharmacy/Dr. Rogene Houston -Blood and urine cultures pending (UA negative) -Will admit to SDU with telemetry given the severity of illness at initial presentation and continue to monitor; he may be appropriate for transfer to med surg bed tomorrow -Will add HIV -Will trend lactate to ensure improvement -Suggest that day team consult GI in the AM -Consideration surgery consult for hemicolectomy given apparent refractory diverticulitis  Lung nodule -Suggest f/u CT (without contrast) in 7/18  HTN -Hold Lisinopril-HCTZ due to borderline hypotension -Will also hold ASA for now  HLD -Hold Zetia  Tobacco dependence -Encourage cessation.  This was discussed with the patient and should be reviewed on an ongoing basis.   -Patch ordered at patient request.   DVT  prophylaxis: Lovenox  Code Status:  Full - confirmed with patient/family Family Communication: Wife present throughout evaluation   Disposition Plan:  Home once clinically improved Consults called: Suggest GI in AM, may also need surgery  Admission status: Admit - It is my clinical opinion that admission to INPATIENT is reasonable and necessary because this patient will require at least 2 midnights in the hospital to treat this condition based on the medical complexity of the problems presented.  Given the aforementioned information, the predictability of an adverse outcome is felt to be significant.    Karmen Bongo MD [facility]  If 7PM-7AM, please contact night-coverage [email or web address] Password [facility]  02/28/2017, 11:21 PM

## 2017-03-01 DIAGNOSIS — K5732 Diverticulitis of large intestine without perforation or abscess without bleeding: Secondary | ICD-10-CM

## 2017-03-01 DIAGNOSIS — E785 Hyperlipidemia, unspecified: Secondary | ICD-10-CM

## 2017-03-01 DIAGNOSIS — F17200 Nicotine dependence, unspecified, uncomplicated: Secondary | ICD-10-CM

## 2017-03-01 DIAGNOSIS — R911 Solitary pulmonary nodule: Secondary | ICD-10-CM

## 2017-03-01 DIAGNOSIS — I10 Essential (primary) hypertension: Secondary | ICD-10-CM

## 2017-03-01 DIAGNOSIS — A419 Sepsis, unspecified organism: Principal | ICD-10-CM

## 2017-03-01 LAB — BASIC METABOLIC PANEL
Anion gap: 11 (ref 5–15)
BUN: 11 mg/dL (ref 6–20)
CO2: 21 mmol/L — ABNORMAL LOW (ref 22–32)
Calcium: 8.4 mg/dL — ABNORMAL LOW (ref 8.9–10.3)
Chloride: 103 mmol/L (ref 101–111)
Creatinine, Ser: 0.99 mg/dL (ref 0.61–1.24)
GFR calc Af Amer: >60 mL/min (ref >60)
GFR calc non Af Amer: >60 mL/min (ref >60)
Glucose, Bld: 75 mg/dL (ref 65–99)
Potassium: 3.7 mmol/L (ref 3.5–5.1)
Sodium: 135 mmol/L (ref 135–145)

## 2017-03-01 LAB — CBC
HCT: 43.5 % (ref 39.0–52.0)
Hemoglobin: 15.2 g/dL (ref 13.0–17.0)
MCH: 31.1 pg (ref 26.0–34.0)
MCHC: 34.9 g/dL (ref 30.0–36.0)
MCV: 89.1 fL (ref 78.0–100.0)
Platelets: 242 10*3/uL (ref 150–400)
RBC: 4.88 MIL/uL (ref 4.22–5.81)
RDW: 14.2 % (ref 11.5–15.5)
WBC: 16.5 10*3/uL — ABNORMAL HIGH (ref 4.0–10.5)

## 2017-03-01 LAB — URINE CULTURE: Culture: NO GROWTH

## 2017-03-01 LAB — LACTIC ACID, PLASMA: Lactic Acid, Venous: 2.2 mmol/L (ref 0.5–1.9)

## 2017-03-01 LAB — PROTIME-INR
INR: 1.11
Prothrombin Time: 14.3 seconds (ref 11.4–15.2)

## 2017-03-01 LAB — APTT: aPTT: 25 seconds (ref 24–36)

## 2017-03-01 LAB — MRSA PCR SCREENING: MRSA by PCR: NEGATIVE

## 2017-03-01 LAB — PROCALCITONIN: Procalcitonin: 3.23 ng/mL

## 2017-03-01 MED ORDER — AZTREONAM IN DEXTROSE 2 GM/50ML IV SOLN
2.0000 g | Freq: Three times a day (TID) | INTRAVENOUS | Status: DC
  Administered 2017-03-01: 2 g via INTRAVENOUS
  Filled 2017-03-01 (×2): qty 50

## 2017-03-01 MED ORDER — NICOTINE 21 MG/24HR TD PT24
21.0000 mg | MEDICATED_PATCH | Freq: Every day | TRANSDERMAL | Status: DC
  Administered 2017-03-01 – 2017-03-05 (×5): 21 mg via TRANSDERMAL
  Filled 2017-03-01 (×5): qty 1

## 2017-03-01 MED ORDER — SODIUM CHLORIDE 0.9 % IV SOLN
1.0000 g | Freq: Three times a day (TID) | INTRAVENOUS | Status: DC
  Administered 2017-03-01 – 2017-03-05 (×13): 1 g via INTRAVENOUS
  Filled 2017-03-01 (×16): qty 1

## 2017-03-01 MED ORDER — MORPHINE SULFATE (PF) 4 MG/ML IV SOLN
2.0000 mg | INTRAVENOUS | Status: DC | PRN
  Administered 2017-03-02: 2 mg via INTRAVENOUS
  Filled 2017-03-01 (×2): qty 1

## 2017-03-01 MED ORDER — VANCOMYCIN HCL 10 G IV SOLR
1250.0000 mg | Freq: Two times a day (BID) | INTRAVENOUS | Status: DC
  Administered 2017-03-01: 1250 mg via INTRAVENOUS
  Filled 2017-03-01: qty 1250

## 2017-03-01 MED ORDER — SODIUM CHLORIDE 0.9 % IV BOLUS (SEPSIS)
500.0000 mL | Freq: Once | INTRAVENOUS | Status: AC
  Administered 2017-03-01: 500 mL via INTRAVENOUS

## 2017-03-01 NOTE — Care Management Note (Signed)
Case Management Note  Patient Details  Name: Farhan Jean MRN: [id number] Date of Birth: Nov 17, 1960  Subjective/Objective:       abd. Pain and sepsis             Action/Plan:Date:  March 01, 2017 Chart reviewed for concurrent status and case management needs. Will continue to follow patient progress. Discharge Planning: following for needs Expected discharge date: [id number] Velva Harman, BSN, [place], [place]   Expected Discharge Date:                  Expected Discharge Plan:  Home/Self Care  In-House Referral:     Discharge planning Services     Post Acute Care Choice:    Choice offered to:     DME Arranged:    DME Agency:     HH Arranged:    [place] Agency:     Status of Service:  In process, will continue to follow  If discussed at Long Length of Stay Meetings, dates discussed:    Additional Comments:  Leeroy Cha, RN 03/01/2017, 11:11 AM

## 2017-03-01 NOTE — Progress Notes (Signed)
Pharmacy Antibiotic Note  Dakota Santos is a 56 y.o. male admitted on 02/28/2017 with abdominal pain. Pt has a history of diverticulitis and has completed courses of cipro/flagyl over the past couple months. Originally started on Aztreonam/Levaquin/Vancomycin in ED, but will change to Meropenem per pharmacy for sepsis/intra-abdominal course with PCN allergy as discussed with Dr Dyann Kief.    03/01/2017:  D1 antibiotics  Tm 102  Leukocytosis (WBC 16.5, ANC 14.8)  LA, PCT elevated  Renal function at baseline, CrCl >164ml/min  Plan: -Meropenem 1gm IV q8h -Follow cx data, patient progress  Height: 5\' 10"  (177.8 cm) Weight: 219 lb 9.3 oz (99.6 kg) IBW/kg (Calculated) : 73  Temp (24hrs), Avg:99.6 F (37.6 C), Min:97.8 F (36.6 C), Max:102 F (38.9 C)   Recent Labs Lab 02/28/17 1604 02/28/17 1617 02/28/17 1945 03/01/17 0033 03/01/17 0353  WBC 16.3*  --   --   --  16.5*  CREATININE 1.14  --   --   --  0.99  LATICACIDVEN  --  5.80* 2.22* 2.2*  --     Estimated Creatinine Clearance: 100.9 mL/min (by C-G formula based on SCr of 0.99 mg/dL).    Allergies  Allergen Reactions  . Penicillins     rash    Antimicrobials this admission: 4/19 vancomycin >4/20 4/19 aztreonam >4/20 4/19 levaquin >4/20 4/20 Meropenem>>  Dose adjustments this admission: N/A  Microbiology results: 4/19 blood cx: sent 4/19 urine cx: sent 4/19 MRSA PCR: negative  Netta Cedars, PharmD, BCPS Pager: [phone] 03/01/2017 8:45 AM

## 2017-03-01 NOTE — Progress Notes (Signed)
[facility] PROGRESS NOTE  Dakota Santos LFY:[id number] DOB: Aug 29, 1961 DOA: 02/28/2017 PCP: [name], MD  Interim summary and HPI 56 y.o. male with medical history significant of HTN, HLD, and diverticulitis (per patient has been experiencing LLQ pain for the last 5-6 years); with 3rd episode of diverticulitis flare in this year.  Patient found to meet sepsis criteria on admission with (fever, tachycardia, elevated lactic acid, tachypnea and leukocytosis).  Assessment/Plan: 1-Sepsis due to diverticulitis -no perforation or abscess seen on CT scan -will switch antibiotics to meropenem  -continue IVF's, antipyretics and supportive care -patient with soft BP, but hemodynamically stable -given ongoing diarrhea today and back to back use of antibiotics in the last 2 months, will check for C. Diff and also will request GI pathogen by PCR. -will follow clinical response -case discussed with Dr. Michail Sermon (GI service), no further recommendations provided currently. Outpatient follow up with Dr. Paulita Fujita and eventually will need elective hemicolectomy. -will advance diet to Clear Liquids  2-Lung Nodule -given hx of smoking, will need repeat CT scan on 7/18 -further work up as an outpatient as needed  -patient encouraged to stop smoking   3-HTN -currently with soft BP (in setting of sepsis) -continue aggressive IVF's -continue holding antihypertensive agents  4-HLD -will hold zetia for now  5-Tobacco abuse -cessation counseling provided -will use nicotine patch   6-Lactic acidosis  -due to sepsis -will continue aggressive IVF's -follow lactic acid trend   Code Status: Full Family Communication: no family at bedside  Disposition Plan: will keep in stepdown; continue IV antibiotics, continue aggressive IVF's resuscitation and supportive care. Will follow stool cx's.   Consultants:  GI (Dr. Michail Sermon curbside and case discussed; no further recommendations  currently. Continue current treatment for diverticulitis )  Procedures:  See below for x-ray reports   Antibiotics:  vacomycin, Aztreonam and Levaquin 4/19>>>>4/20  Meropenem 4/20  HPI/Subjective: Patient still spiking fever and complaining of LLQ pain. No nausea or vomiting. Had 2 very loose stools.    Objective: Vitals:   03/01/17 0800 03/01/17 1000  BP: (!) 106/41 (!) 98/56  Pulse: 89 74  Resp: (!) 23 (!) 21  Temp: (!) 102 F (38.9 C)     Intake/Output Summary (Last 24 hours) at 03/01/17 1022 Last data filed at 03/01/17 1000  Gross per 24 hour  Intake             6370 ml  Output             1000 ml  Net             5370 ml   Filed Weights   02/28/17 1431 02/28/17 2135  Weight: 95.3 kg (210 lb) 99.6 kg (219 lb 9.3 oz)    Exam:   General:  Febrile, in mild distress secondary to ongoing abd pain. Denies CP and SOB. No nausea, no vomiting.   Cardiovascular: tachycardic, no rubs, no gallops, no murmrus  Respiratory: positive tachypnea, no wheezing, no crackles   Abdomen: LLQ pain on palpation, positive rebound, positive BS  Musculoskeletal: no edema or cyanosis   Data Reviewed: Basic Metabolic Panel:  Recent Labs Lab 02/28/17 1604 03/01/17 0353  NA 134* 135  K 3.8 3.7  CL 101 103  CO2 20* 21*  GLUCOSE 95 75  BUN 12 11  CREATININE 1.14 0.99  CALCIUM 9.0 8.4*   Liver Function Tests:  Recent Labs Lab 02/28/17 1604  AST 39  ALT 42  ALKPHOS 63  BILITOT 1.2  PROT  7.4  ALBUMIN 4.2    Recent Labs Lab 02/28/17 1604  LIPASE 24   CBC:  Recent Labs Lab 02/28/17 1604 03/01/17 0353  WBC 16.3* 16.5*  NEUTROABS 14.8*  --   HGB 18.0* 15.2  HCT 51.6 43.5  MCV 88.4 89.1  PLT 256 242   BNP (last 3 results)  Recent Labs  02/28/17 1604  BNP 15.3   CBG: No results for input(s): GLUCAP in the last 168 hours.  Recent Results (from the past 240 hour(s))  MRSA PCR Screening     Status: None   Collection Time: 02/28/17  9:50 PM  Result  Value Ref Range Status   MRSA by PCR NEGATIVE NEGATIVE Final    Comment:        The GeneXpert MRSA Assay (FDA approved for NASAL specimens only), is one component of a comprehensive MRSA colonization surveillance program. It is not intended to diagnose MRSA infection nor to guide or monitor treatment for MRSA infections.      Studies: Ct Abdomen Pelvis W Contrast  Result Date: 02/28/2017 CLINICAL DATA:  Intermittent lower abdominal pain for several years, worse since March 2018. Chills, fever and nausea today. EXAM: CT ABDOMEN AND PELVIS WITH CONTRAST TECHNIQUE: Multidetector CT imaging of the abdomen and pelvis was performed using the standard protocol following bolus administration of intravenous contrast. CONTRAST:  1100mL ISOVUE-300 IOPAMIDOL (ISOVUE-300) INJECTION 61% COMPARISON:  Previous examinations, the most recent dated 01/25/2017. FINDINGS: Lower chest: A partially included sub solid nodular density is demonstrated in the left lower lobe on the first image. This measures 1.3 cm in maximum diameter and measured 1.5 cm in maximum diameter on 12/25/2016, without significant change in appearance. Hepatobiliary: Mild diffuse low density of the liver relative to the spleen. Normal appearing gallbladder. Pancreas: Unremarkable. No pancreatic ductal dilatation or surrounding inflammatory changes. Spleen: Normal in size without focal abnormality. Adrenals/Urinary Tract: Adrenal glands are unremarkable. Small bilateral renal cysts without renal calculi or hydronephrosis. Bladder is unremarkable. Stomach/Bowel: Multiple sigmoid colon diverticula. Pericolonic soft tissue stranding adjacent to some of the diverticula in the proximal sigmoid colon. No fluid collections or free peritoneal air. Unremarkable stomach, small bowel and appendix. Vascular/Lymphatic: Minimal atheromatous arterial calcifications. No enlarged lymph nodes. Reproductive: Mildly enlarged prostate gland. Other: Small bilateral  inguinal hernias containing fat. Tiny umbilical hernia containing fat. Musculoskeletal: Lumbar and lower thoracic spine degenerative changes. IMPRESSION: 1. Sigmoid colon diverticulosis and proximal diverticulitis without abscess. 2. Mild diffuse hepatic steatosis. 3. Grossly stable partially included left lower lobe subsolid nodule. A follow-up chest CT without contrast is recommended in 3 months. Electronically Signed   By: Claudie Revering M.D.   On: 02/28/2017 18:35   Dg Chest Port 1 View  Result Date: 02/28/2017 CLINICAL DATA:  Fever and chills. EXAM: PORTABLE CHEST 1 VIEW COMPARISON:  None. FINDINGS: 1558 hours. Lungs are hyperexpanded. The lungs are clear wiithout focal pneumonia, edema, pneumothorax or pleural effusion. The cardiopericardial silhouette is within normal limits for size. The visualized bony structures of the thorax are intact. Telemetry leads overlie the chest. IMPRESSION: Hyperexpansion without acute cardiopulmonary findings. Electronically Signed   By: Misty Stanley M.D.   On: 02/28/2017 16:24    Scheduled Meds: . enoxaparin (LOVENOX) injection  40 mg Subcutaneous QHS  . nicotine  14 mg Transdermal Once  . sodium chloride flush  3 mL Intravenous Q12H   Continuous Infusions: . sodium chloride 150 mL/hr at 03/01/17 1000  . meropenem (MERREM) IV 1 g (03/01/17 0939)  Principal Problem:   Sepsis ([place]) Active Problems:   Diverticulitis   Lung nodule seen on imaging study   Essential hypertension   Hyperlipidemia   Tobacco dependence    Time spent: 30 minutes    Barton Dubois  [facility] Pager [phone]. If 7PM-7AM, please contact night-coverage at [email or web address], password [facility] 03/01/2017, 10:22 AM  LOS: 1 day

## 2017-03-02 DIAGNOSIS — R197 Diarrhea, unspecified: Secondary | ICD-10-CM

## 2017-03-02 DIAGNOSIS — R140 Abdominal distension (gaseous): Secondary | ICD-10-CM

## 2017-03-02 LAB — GASTROINTESTINAL PANEL BY PCR, STOOL (REPLACES STOOL CULTURE)

## 2017-03-02 LAB — BASIC METABOLIC PANEL
Anion gap: 7 (ref 5–15)
BUN: 8 mg/dL (ref 6–20)
CO2: 20 mmol/L — ABNORMAL LOW (ref 22–32)
Calcium: 7.9 mg/dL — ABNORMAL LOW (ref 8.9–10.3)
Chloride: 108 mmol/L (ref 101–111)
Creatinine, Ser: 0.98 mg/dL (ref 0.61–1.24)
GFR calc Af Amer: >60 mL/min (ref >60)
GFR calc non Af Amer: >60 mL/min (ref >60)
Glucose, Bld: 92 mg/dL (ref 65–99)
Potassium: 3.9 mmol/L (ref 3.5–5.1)
Sodium: 135 mmol/L (ref 135–145)

## 2017-03-02 LAB — C DIFFICILE QUICK SCREEN W PCR REFLEX
C Diff antigen: NEGATIVE
C Diff interpretation: NOT DETECTED
C Diff toxin: NEGATIVE

## 2017-03-02 LAB — CBC
HCT: 40.6 % (ref 39.0–52.0)
Hemoglobin: 13.6 g/dL (ref 13.0–17.0)
MCH: 30 pg (ref 26.0–34.0)
MCHC: 33.5 g/dL (ref 30.0–36.0)
MCV: 89.4 fL (ref 78.0–100.0)
Platelets: 216 10*3/uL (ref 150–400)
RBC: 4.54 MIL/uL (ref 4.22–5.81)
RDW: 14.5 % (ref 11.5–15.5)
WBC: 9.9 10*3/uL (ref 4.0–10.5)

## 2017-03-02 LAB — HIV ANTIBODY (ROUTINE TESTING W REFLEX): HIV Screen 4th Generation wRfx: NONREACTIVE

## 2017-03-02 MED ORDER — SIMETHICONE 40 MG/0.6ML PO SUSP
40.0000 mg | Freq: Four times a day (QID) | ORAL | Status: DC | PRN
  Administered 2017-03-02: 40 mg via ORAL
  Filled 2017-03-02 (×2): qty 0.6

## 2017-03-02 MED ORDER — SACCHAROMYCES BOULARDII 250 MG PO CAPS
250.0000 mg | ORAL_CAPSULE | Freq: Two times a day (BID) | ORAL | Status: DC
  Administered 2017-03-02 – 2017-03-05 (×7): 250 mg via ORAL
  Filled 2017-03-02 (×7): qty 1

## 2017-03-02 NOTE — Progress Notes (Signed)
Patient transferred from ICU. Alert and oriented x3. Denies pain. Oriented to room and environment. Will continue to monitor.

## 2017-03-02 NOTE — Progress Notes (Signed)
Results for GI pathogen panel was negative. Enteric precaution d/c'd per protocol.

## 2017-03-02 NOTE — Progress Notes (Signed)
[facility] PROGRESS NOTE  Dakota Santos ESP:[id number] DOB: [birth year] DOA: 02/28/2017 PCP: [name], MD  Interim summary and HPI 57 y.o. male with medical history significant of HTN, HLD, and diverticulitis (per patient has been experiencing LLQ pain for the last 5-6 years); with 3rd episode of diverticulitis flare in this year.  Patient found to meet sepsis criteria on admission with (fever, tachycardia, elevated lactic acid, tachypnea and leukocytosis).  Assessment/Plan: 1-Sepsis due to diverticulitis/diarrhea -no perforation or abscess seen on CT scan -patient is now afebrile and denies nausea and vomiting -lactic acid WNL, WBC's trending down and abd pain improving -will continue IV meropenem  -continue supportive care, antipyretics and PRN analgesics -patient is hemodynamically stable -C. Diff neg -GI pathogen pending  -case discussed with Dr. Michail Sermon (GI service), no further recommendations provided currently. Outpatient follow up with Dr. Paulita Fujita and eventually will need elective hemicolectomy. -will continue clear liquid diets, start florastor and PRN simethicone.  2-Lung Nodule -given hx of smoking, will need repeat CT scan on 7/18 -further work up as an outpatient as needed  -patient encouraged to stop smoking  -no current red flags symptoms reported by patient   3-HTN -BP improving and rising -will continue holding antihypertensive agents -will decrease IVF's rate  4-HLD -will continue holding Zetia for now   5-Tobacco abuse -cessation counseling encouraged; discussed the use of 1-800-QUIT-NOW -will continue using nicotine patch  6-Lactic acidosis  -due to sepsis -lactic acid resolved with IVF's resuscitation  Code Status: Full Family Communication: no family at bedside  Disposition Plan: will continue IV antibiotics, continue CLD; will transfer to med-surg bed. Will add florastor for ongoing loose stools/diarrhea and will use simethicone  for bloating sensation.follow GI pathogen.  Consultants:  GI (Dr. Michail Sermon curbside and case discussed; no further recommendations currently. Continue current treatment for diverticulitis )  Procedures:  See below for x-ray reports   Antibiotics:  vacomycin, Aztreonam and Levaquin 4/19>>>>4/20  Meropenem 4/20  HPI/Subjective: Patient is afebrile and denying CP and SOB. Still with some LLQ discomfort (even improved) and endorsing bloated sensation. No nausea, no vomiting.    Objective: Vitals:   03/02/17 0800 03/02/17 1000  BP: 123/82 123/81  Pulse: 65 65  Resp: 10 17  Temp: 97.6 F (36.4 C)     Intake/Output Summary (Last 24 hours) at 03/02/17 1050 Last data filed at 03/02/17 1000  Gross per 24 hour  Intake           3627.5 ml  Output              800 ml  Net           2827.5 ml   Filed Weights   02/28/17 1431 02/28/17 2135  Weight: 95.3 kg (210 lb) 99.6 kg (219 lb 9.3 oz)    Exam:   General:  Patient is afebrile, no CP, no SOB and in no acute distress. Still with abd discomfort (LLQ) and feeling bloated. Denies nausea and vomiting.  Cardiovascular: S1 and S2, no rubs or gallops,no murmurs  Respiratory: good air movement bilaterally, no wheezing, no crackles  Abdomen: soft, slightly distended, positive BS, tender on LLQ with palpation   Musculoskeletal: no edema or cyanosis; no clubbing   Data Reviewed: Basic Metabolic Panel:  Recent Labs Lab 02/28/17 1604 03/01/17 0353 03/02/17 0355  NA 134* 135 135  K 3.8 3.7 3.9  CL 101 103 108  CO2 20* 21* 20*  GLUCOSE 95 75 92  BUN 12 11 8   CREATININE  1.14 0.99 0.98  CALCIUM 9.0 8.4* 7.9*   Liver Function Tests:  Recent Labs Lab 02/28/17 1604  AST 39  ALT 42  ALKPHOS 63  BILITOT 1.2  PROT 7.4  ALBUMIN 4.2    Recent Labs Lab 02/28/17 1604  LIPASE 24   CBC:  Recent Labs Lab 02/28/17 1604 03/01/17 0353 03/02/17 0355  WBC 16.3* 16.5* 9.9  NEUTROABS 14.8*  --   --   HGB 18.0* 15.2 13.6   HCT 51.6 43.5 40.6  MCV 88.4 89.1 89.4  PLT 256 242 216   BNP (last 3 results)  Recent Labs  02/28/17 1604  BNP 15.3   CBG: No results for input(s): GLUCAP in the last 168 hours.  Recent Results (from the past 240 hour(s))  Urine culture     Status: None   Collection Time: 02/28/17  4:04 PM  Result Value Ref Range Status   Specimen Description URINE, RANDOM  Final   Special Requests NONE  Final   Culture   Final    NO GROWTH Performed at [facility], [address], [place],  [address]    Report Status 03/01/2017 FINAL  Final  MRSA PCR Screening     Status: None   Collection Time: 02/28/17  9:50 PM  Result Value Ref Range Status   MRSA by PCR NEGATIVE NEGATIVE Final    Comment:        The GeneXpert MRSA Assay (FDA approved for NASAL specimens only), is one component of a comprehensive MRSA colonization surveillance program. It is not intended to diagnose MRSA infection nor to guide or monitor treatment for MRSA infections.   C difficile quick scan w PCR reflex     Status: None   Collection Time: 03/02/17 12:10 AM  Result Value Ref Range Status   C Diff antigen NEGATIVE NEGATIVE Final   C Diff toxin NEGATIVE NEGATIVE Final   C Diff interpretation No C. difficile detected.  Final     Studies: Ct Abdomen Pelvis W Contrast  Result Date: 02/28/2017 CLINICAL DATA:  Intermittent lower abdominal pain for several years, worse since March 2018. Chills, fever and nausea today. EXAM: CT ABDOMEN AND PELVIS WITH CONTRAST TECHNIQUE: Multidetector CT imaging of the abdomen and pelvis was performed using the standard protocol following bolus administration of intravenous contrast. CONTRAST:  184mL ISOVUE-300 IOPAMIDOL (ISOVUE-300) INJECTION 61% COMPARISON:  Previous examinations, the most recent dated 01/25/2017. FINDINGS: Lower chest: A partially included sub solid nodular density is demonstrated in the left lower lobe on the first image. This measures 1.3 cm in  maximum diameter and measured 1.5 cm in maximum diameter on 12/25/2016, without significant change in appearance. Hepatobiliary: Mild diffuse low density of the liver relative to the spleen. Normal appearing gallbladder. Pancreas: Unremarkable. No pancreatic ductal dilatation or surrounding inflammatory changes. Spleen: Normal in size without focal abnormality. Adrenals/Urinary Tract: Adrenal glands are unremarkable. Small bilateral renal cysts without renal calculi or hydronephrosis. Bladder is unremarkable. Stomach/Bowel: Multiple sigmoid colon diverticula. Pericolonic soft tissue stranding adjacent to some of the diverticula in the proximal sigmoid colon. No fluid collections or free peritoneal air. Unremarkable stomach, small bowel and appendix. Vascular/Lymphatic: Minimal atheromatous arterial calcifications. No enlarged lymph nodes. Reproductive: Mildly enlarged prostate gland. Other: Small bilateral inguinal hernias containing fat. Tiny umbilical hernia containing fat. Musculoskeletal: Lumbar and lower thoracic spine degenerative changes. IMPRESSION: 1. Sigmoid colon diverticulosis and proximal diverticulitis without abscess. 2. Mild diffuse hepatic steatosis. 3. Grossly stable partially included left lower lobe subsolid  nodule. A follow-up chest CT without contrast is recommended in 3 months. Electronically Signed   By: Claudie Revering M.D.   On: 02/28/2017 18:35   Dg Chest Port 1 View  Result Date: 02/28/2017 CLINICAL DATA:  Fever and chills. EXAM: PORTABLE CHEST 1 VIEW COMPARISON:  None. FINDINGS: 1558 hours. Lungs are hyperexpanded. The lungs are clear wiithout focal pneumonia, edema, pneumothorax or pleural effusion. The cardiopericardial silhouette is within normal limits for size. The visualized bony structures of the thorax are intact. Telemetry leads overlie the chest. IMPRESSION: Hyperexpansion without acute cardiopulmonary findings. Electronically Signed   By: Misty Stanley M.D.   On: 02/28/2017  16:24    Scheduled Meds: . enoxaparin (LOVENOX) injection  40 mg Subcutaneous QHS  . nicotine  21 mg Transdermal Daily  . saccharomyces boulardii  250 mg Oral BID  . sodium chloride flush  3 mL Intravenous Q12H   Continuous Infusions: . sodium chloride 150 mL/hr at 03/02/17 1000  . meropenem (MERREM) IV 1 g (03/02/17 0953)    Principal Problem:   Sepsis ([place]) Active Problems:   Diverticulitis   Lung nodule seen on imaging study   Essential hypertension   Hyperlipidemia   Tobacco dependence    Time spent: 25 minutes    Barton Dubois  [facility] Pager [phone]. If 7PM-7AM, please contact night-coverage at [email or web address], password [facility] 03/02/2017, 10:50 AM  LOS: 2 days

## 2017-03-03 ENCOUNTER — Inpatient Hospital Stay (HOSPITAL_COMMUNITY): Payer: 59

## 2017-03-03 DIAGNOSIS — R1032 Left lower quadrant pain: Secondary | ICD-10-CM

## 2017-03-03 LAB — CBC
HCT: 40.1 % (ref 39.0–52.0)
Hemoglobin: 13.9 g/dL (ref 13.0–17.0)
MCH: 30.5 pg (ref 26.0–34.0)
MCHC: 34.7 g/dL (ref 30.0–36.0)
MCV: 88.1 fL (ref 78.0–100.0)
Platelets: 246 10*3/uL (ref 150–400)
RBC: 4.55 MIL/uL (ref 4.22–5.81)
RDW: 14.1 % (ref 11.5–15.5)
WBC: 12.5 10*3/uL — ABNORMAL HIGH (ref 4.0–10.5)

## 2017-03-03 LAB — BASIC METABOLIC PANEL
Anion gap: 9 (ref 5–15)
BUN: 6 mg/dL (ref 6–20)
CO2: 20 mmol/L — ABNORMAL LOW (ref 22–32)
Calcium: 8.2 mg/dL — ABNORMAL LOW (ref 8.9–10.3)
Chloride: 107 mmol/L (ref 101–111)
Creatinine, Ser: 0.86 mg/dL (ref 0.61–1.24)
GFR calc Af Amer: >60 mL/min (ref >60)
GFR calc non Af Amer: >60 mL/min (ref >60)
Glucose, Bld: 103 mg/dL — ABNORMAL HIGH (ref 65–99)
Potassium: 3.5 mmol/L (ref 3.5–5.1)
Sodium: 136 mmol/L (ref 135–145)

## 2017-03-03 NOTE — Progress Notes (Signed)
[facility] PROGRESS NOTE  Talis Iwan OEU:[id number] DOB: Jul 24, 1961 DOA: 02/28/2017 PCP: [name], MD  Interim summary and HPI 56 y.o. male with medical history significant of HTN, HLD, and diverticulitis (per patient has been experiencing LLQ pain for the last 5-6 years); with 3rd episode of diverticulitis flare in this year.  Patient found to meet sepsis criteria on admission with (fever, tachycardia, elevated lactic acid, tachypnea and leukocytosis).  Assessment/Plan: 1-Sepsis due to diverticulitis/diarrhea -no perforation or abscess seen on CT scan on admission  -patient is now afebrile and denies any nausea and vomiting -lactic acid went up slightly, also increased in WBC's overnight and per patient some pain across lower abdomen. -will continue IVF's, continue current IV antibiotics and will get abd x-ray -continue just CLD for now  -will continue supportive care, antipyretics and PRN analgesics -patient is hemodynamically stable -C. Diff and GI pathogen panel neg -case discussed with Dr. Michail Sermon (GI service), no further recommendations provided currently. Outpatient follow up with Dr. Paulita Fujita and eventually will need elective hemicolectomy. -continue PRN simethicone and continue florastor  2-Lung Nodule -given hx of smoking, will need repeat CT scan on 7/18 -further work up as an outpatient as needed  -patient encouraged to stop smoking  -no current red flags symptoms reported by patient   3-HTN -BP improved/rising; but not requiring medications yet -will continue holding antihypertensive agents for now -continue IVF's  4-HLD -will continue holding Zetia for now  -planning to resume at discharge   5-Tobacco abuse -cessation counseling encouraged; discussed the use of 1-800-QUIT-NOW -will continue using nicotine patch  6-Lactic acidosis  -due to sepsis -lactic acid level back up to 2.2 -will continue IVF's resuscitation and will follow  trend -as mentioned above will check Abd x-ray and if needed repeat CT scan.  Code Status: Full Family Communication: no family at bedside  Disposition Plan: will continue IV antibiotics, continue CLD; will check abd x-ray and follow clinical response. Continue PRN analgesics and antipyretics. follow clinical response and continue supportive care.  Consultants:  GI (Dr. Michail Sermon curbside and case discussed; no further recommendations currently. Continue current treatment for diverticulitis )  Procedures:  See below for x-ray reports   Antibiotics:  vacomycin, Aztreonam and Levaquin 4/19>>>>4/20  Meropenem 4/20  HPI/Subjective: Patient reporting lower abd pain. No nausea, no vomiting, no CP and no SOB. Has remained afebrile.    Objective: Vitals:   03/03/17 0029 03/03/17 0545  BP:  (!) 113/58  Pulse:  80  Resp:  20  Temp: 98.6 F (37 C) 98.6 F (37 C)    Intake/Output Summary (Last 24 hours) at 03/03/17 1016 Last data filed at 03/03/17 1008  Gross per 24 hour  Intake           2187.5 ml  Output                0 ml  Net           2187.5 ml   Filed Weights   02/28/17 1431 02/28/17 2135 03/02/17 1227  Weight: 95.3 kg (210 lb) 99.6 kg (219 lb 9.3 oz) 100 kg (220 lb 7.4 oz)    Exam:   General:  Patient is afebrile, in no acute distress; denying CP, SOB, nausea and vomiting. Still with abd pain (mainly affecting lower quadrants)  Cardiovascular: regular rate, no rubs or gallops, no murmurs; no JVD  Respiratory: CTA bilaterally  Abdomen: soft, no guarding, positive tenderness on his LLQ and radiating across to RLQ. No rebound.  Positive BS   Musculoskeletal: no cyanosis or clubbing, no edema appreciated.  Data Reviewed: Basic Metabolic Panel:  Recent Labs Lab 02/28/17 1604 03/01/17 0353 03/02/17 0355 03/03/17 0415  NA 134* 135 135 136  K 3.8 3.7 3.9 3.5  CL 101 103 108 107  CO2 20* 21* 20* 20*  GLUCOSE 95 75 92 103*  BUN 12 11 8 6   CREATININE 1.14  0.99 0.98 0.86  CALCIUM 9.0 8.4* 7.9* 8.2*   Liver Function Tests:  Recent Labs Lab 02/28/17 1604  AST 39  ALT 42  ALKPHOS 63  BILITOT 1.2  PROT 7.4  ALBUMIN 4.2    Recent Labs Lab 02/28/17 1604  LIPASE 24   CBC:  Recent Labs Lab 02/28/17 1604 03/01/17 0353 03/02/17 0355 03/03/17 0415  WBC 16.3* 16.5* 9.9 12.5*  NEUTROABS 14.8*  --   --   --   HGB 18.0* 15.2 13.6 13.9  HCT 51.6 43.5 40.6 40.1  MCV 88.4 89.1 89.4 88.1  PLT 256 242 216 246   BNP (last 3 results)  Recent Labs  02/28/17 1604  BNP 15.3   CBG: No results for input(s): GLUCAP in the last 168 hours.  Recent Results (from the past 240 hour(s))  Urine culture     Status: None   Collection Time: 02/28/17  4:04 PM  Result Value Ref Range Status   Specimen Description URINE, RANDOM  Final   Special Requests NONE  Final   Culture   Final    NO GROWTH Performed at [facility], [address], [place], [place] [address]    Report Status 03/01/2017 FINAL  Final  Blood Culture (routine x 2)     Status: None (Preliminary result)   Collection Time: 02/28/17  4:25 PM  Result Value Ref Range Status   Specimen Description BLOOD BLOOD RIGHT HAND  Final   Special Requests   Final    BOTTLES DRAWN AEROBIC AND ANAEROBIC Blood Culture adequate volume   Culture   Final    NO GROWTH 2 DAYS Performed at [facility], [place] [address], [place], [place] [address]    Report Status PENDING  Incomplete  Blood Culture (routine x 2)     Status: None (Preliminary result)   Collection Time: 02/28/17  4:35 PM  Result Value Ref Range Status   Specimen Description BLOOD BLOOD LEFT HAND  Final   Special Requests   Final    BOTTLES DRAWN AEROBIC AND ANAEROBIC Blood Culture results may not be optimal due to an inadequate volume of blood received in culture bottles   Culture   Final    NO GROWTH 2 DAYS Performed at [facility], [place] [address] [place], [place] [address]    Report Status PENDING   Incomplete  MRSA PCR Screening     Status: None   Collection Time: 02/28/17  9:50 PM  Result Value Ref Range Status   MRSA by PCR NEGATIVE NEGATIVE Final    Comment:        The GeneXpert MRSA Assay (FDA approved for NASAL specimens only), is one component of a comprehensive MRSA colonization surveillance program. It is not intended to diagnose MRSA infection nor to guide or monitor treatment for MRSA infections.   C difficile quick scan w PCR reflex     Status: None   Collection Time: 03/02/17 12:10 AM  Result Value Ref Range Status   C Diff antigen NEGATIVE NEGATIVE Final   C Diff  toxin NEGATIVE NEGATIVE Final   C Diff interpretation No C. difficile detected.  Final  Gastrointestinal Panel by PCR , Stool     Status: None   Collection Time: 03/02/17 12:10 AM  Result Value Ref Range Status   Campylobacter species NOT DETECTED NOT DETECTED Final   Plesimonas shigelloides NOT DETECTED NOT DETECTED Final   Salmonella species NOT DETECTED NOT DETECTED Final   Yersinia enterocolitica NOT DETECTED NOT DETECTED Final   Vibrio species NOT DETECTED NOT DETECTED Final   Vibrio cholerae NOT DETECTED NOT DETECTED Final   Enteroaggregative E coli (EAEC) NOT DETECTED NOT DETECTED Final   Enteropathogenic E coli (EPEC) NOT DETECTED NOT DETECTED Final   Enterotoxigenic E coli (ETEC) NOT DETECTED NOT DETECTED Final   Shiga like toxin producing E coli (STEC) NOT DETECTED NOT DETECTED Final   Shigella/Enteroinvasive E coli (EIEC) NOT DETECTED NOT DETECTED Final   Cryptosporidium NOT DETECTED NOT DETECTED Final   Cyclospora cayetanensis NOT DETECTED NOT DETECTED Final   Entamoeba histolytica NOT DETECTED NOT DETECTED Final   Giardia lamblia NOT DETECTED NOT DETECTED Final   Adenovirus F40/41 NOT DETECTED NOT DETECTED Final   Astrovirus NOT DETECTED NOT DETECTED Final   Norovirus GI/GII NOT DETECTED NOT DETECTED Final   Rotavirus A NOT DETECTED NOT DETECTED Final   Sapovirus (I, II, IV, and V)  NOT DETECTED NOT DETECTED Final     Studies: No results found.  Scheduled Meds: . enoxaparin (LOVENOX) injection  40 mg Subcutaneous QHS  . nicotine  21 mg Transdermal Daily  . saccharomyces boulardii  250 mg Oral BID  . sodium chloride flush  3 mL Intravenous Q12H   Continuous Infusions: . sodium chloride 75 mL/hr at 03/03/17 0741  . meropenem (MERREM) IV 1 g (03/03/17 5038)    Time spent: 25 minutes    Barton Dubois  [facility] Pager [phone]. If 7PM-7AM, please contact night-coverage at [email or web address], password [facility] 03/03/2017, 10:16 AM  LOS: 3 days

## 2017-03-03 NOTE — Progress Notes (Signed)
Texted Dr. Dyann Kief earlier on the results of abdominal x ray.

## 2017-03-04 DIAGNOSIS — R7881 Bacteremia: Secondary | ICD-10-CM

## 2017-03-04 LAB — BLOOD CULTURE ID PANEL (REFLEXED)

## 2017-03-04 LAB — LACTIC ACID, PLASMA: Lactic Acid, Venous: 0.8 mmol/L (ref 0.5–1.9)

## 2017-03-04 LAB — BASIC METABOLIC PANEL
Anion gap: 8 (ref 5–15)
BUN: 6 mg/dL (ref 6–20)
CO2: 19 mmol/L — ABNORMAL LOW (ref 22–32)
Calcium: 8.4 mg/dL — ABNORMAL LOW (ref 8.9–10.3)
Chloride: 110 mmol/L (ref 101–111)
Creatinine, Ser: 0.75 mg/dL (ref 0.61–1.24)
GFR calc Af Amer: >60 mL/min (ref >60)
GFR calc non Af Amer: >60 mL/min (ref >60)
Glucose, Bld: 99 mg/dL (ref 65–99)
Potassium: 3.5 mmol/L (ref 3.5–5.1)
Sodium: 137 mmol/L (ref 135–145)

## 2017-03-04 LAB — CBC
HCT: 40.9 % (ref 39.0–52.0)
Hemoglobin: 14.3 g/dL (ref 13.0–17.0)
MCH: 30.6 pg (ref 26.0–34.0)
MCHC: 35 g/dL (ref 30.0–36.0)
MCV: 87.6 fL (ref 78.0–100.0)
Platelets: 261 10*3/uL (ref 150–400)
RBC: 4.67 MIL/uL (ref 4.22–5.81)
RDW: 13.9 % (ref 11.5–15.5)
WBC: 9.5 10*3/uL (ref 4.0–10.5)

## 2017-03-04 NOTE — Progress Notes (Signed)
[facility] PROGRESS NOTE  Buffalo Grosser BZJ:[id number] DOB: 07-09-1961 DOA: 02/28/2017 PCP: [name], MD  Interim summary and HPI 56 y.o. male with medical history significant of HTN, HLD, and diverticulitis (per patient has been experiencing LLQ pain for the last 5-6 years); with 3rd episode of diverticulitis flare in this year.  Patient found to meet sepsis criteria on admission with (fever, tachycardia, elevated lactic acid, tachypnea and leukocytosis).  Assessment/Plan: 1-Sepsis due to diverticulitis/diarrhea -no perforation or abscess seen on CT scan on admission  -patient is now afebrile and denies any nausea and vomiting -lactic acid went up slightly, also increased in WBC's overnight and per patient some pain across lower abdomen. -will continue IVF's, continue current IV antibiotics and will get abd x-ray -will advance diet to soft and follow tolerance  -will continue supportive care, antipyretics and PRN analgesics -patient is hemodynamically stable -C. Diff and GI pathogen panel neg -case discussed with Dr. Michail Sermon (GI service), no further recommendations provided currently. Outpatient follow up with Dr. Paulita Fujita and eventually will need elective hemicolectomy. -continue PRN simethicone and continue florastor  2-Lung Nodule -given hx of smoking, will need repeat CT scan on 7/18 -further work up as an outpatient as needed  -patient encouraged to stop smoking  -no current red flags symptoms reported by patient   3-HTN -BP improved/rising; but not requiring medications yet -will continue holding antihypertensive agents for now -continue IVF's  4-HLD -will continue holding Zetia for now  -planning to resume at discharge   5-Tobacco abuse -cessation counseling encouraged; discussed the use of 1-800-QUIT-NOW -will continue using nicotine patch  6-Lactic acidosis  -due to sepsis -lactic acid level 0.8 -eating and drinking better  -will continue  supportive care and IVF's; rate adjusted now  7-1/2 bacteremia: -maybe contaminant  -will follow results -continue current antibiotics   Code Status: Full Family Communication: no family at bedside  Disposition Plan: will continue IV antibiotics, advance diet to soft ; repeat abd x-ray without free air, signs of perforation and SBO. Continue PRN analgesics and antipyretics. follow cultures results. Continue supportive care.  Consultants:  GI (Dr. Michail Sermon curbside and case discussed; no further recommendations currently. Continue current treatment for diverticulitis)  Procedures:  See below for x-ray reports   Antibiotics:  vacomycin, Aztreonam and Levaquin 4/19>>>>4/20  Meropenem 4/20  HPI/Subjective: Afebrile, no CP, no nausea, no vomiting. Feeling much better. 1/2 blood cx's positive for gram neg rods.    Objective: Vitals:   03/04/17 0435 03/04/17 1400  BP: 118/78 122/81  Pulse: 68 60  Resp: 16 16  Temp: 98 F (36.7 C) 98.2 F (36.8 C)    Intake/Output Summary (Last 24 hours) at 03/04/17 1827 Last data filed at 03/04/17 1740  Gross per 24 hour  Intake             1180 ml  Output                0 ml  Net             1180 ml   Filed Weights   02/28/17 1431 02/28/17 2135 03/02/17 1227  Weight: 95.3 kg (210 lb) 99.6 kg (219 lb 9.3 oz) 100 kg (220 lb 7.4 oz)    Exam:   General:  Patient is afebrile and feeling better. No nausea, no vomiting. Still with some abd pain (but much improved).  Cardiovascular: regular rate, no rubs or gallops, no murmurs; no JVD  Respiratory: CTA bilaterally  Abdomen: soft, no guarding, still  with positive tenderness on his LLQ, but much improvement. No rebound. Positive BS   Musculoskeletal: no cyanosis or clubbing, no edema appreciated.  Data Reviewed: Basic Metabolic Panel:  Recent Labs Lab 02/28/17 1604 03/01/17 0353 03/02/17 0355 03/03/17 0415 03/04/17 0431  NA 134* 135 135 136 137  K 3.8 3.7 3.9 3.5 3.5  CL  101 103 108 107 110  CO2 20* 21* 20* 20* 19*  GLUCOSE 95 75 92 103* 99  BUN 12 11 8 6 6   CREATININE 1.14 0.99 0.98 0.86 0.75  CALCIUM 9.0 8.4* 7.9* 8.2* 8.4*   Liver Function Tests:  Recent Labs Lab 02/28/17 1604  AST 39  ALT 42  ALKPHOS 63  BILITOT 1.2  PROT 7.4  ALBUMIN 4.2    Recent Labs Lab 02/28/17 1604  LIPASE 24   CBC:  Recent Labs Lab 02/28/17 1604 03/01/17 0353 03/02/17 0355 03/03/17 0415 03/04/17 0431  WBC 16.3* 16.5* 9.9 12.5* 9.5  NEUTROABS 14.8*  --   --   --   --   HGB 18.0* 15.2 13.6 13.9 14.3  HCT 51.6 43.5 40.6 40.1 40.9  MCV 88.4 89.1 89.4 88.1 87.6  PLT 256 242 216 246 261   BNP (last 3 results)  Recent Labs  02/28/17 1604  BNP 15.3   CBG: No results for input(s): GLUCAP in the last 168 hours.  Recent Results (from the past 240 hour(s))  Urine culture     Status: None   Collection Time: 02/28/17  4:04 PM  Result Value Ref Range Status   Specimen Description URINE, RANDOM  Final   Special Requests NONE  Final   Culture   Final    NO GROWTH Performed at [facility], [address], [place], [place] [address]    Report Status 03/01/2017 FINAL  Final  Blood Culture (routine x 2)     Status: None (Preliminary result)   Collection Time: 02/28/17  4:25 PM  Result Value Ref Range Status   Specimen Description BLOOD BLOOD RIGHT HAND  Final   Special Requests   Final    BOTTLES DRAWN AEROBIC AND ANAEROBIC Blood Culture adequate volume   Culture   Final    NO GROWTH 4 DAYS Performed at [facility], [place] [address] [place], [place] [address]    Report Status PENDING  Incomplete  Blood Culture (routine x 2)     Status: None (Preliminary result)   Collection Time: 02/28/17  4:35 PM  Result Value Ref Range Status   Specimen Description BLOOD BLOOD LEFT HAND  Final   Special Requests   Final    BOTTLES DRAWN AEROBIC AND ANAEROBIC Blood Culture results may not be optimal due to an inadequate volume of blood received in  culture bottles   Culture  Setup Time   Final    ANAEROBIC BOTTLE ONLY GRAM POSITIVE RODS Organism ID to follow CRITICAL RESULT CALLED TO, READ BACK BY AND VERIFIED WITH: TO BGREEN(PHARMd) BY [facility] 03/04/2017 AT 2:47AM Performed at [facility], [place] [address], [place], [place] [address]    Culture GRAM POSITIVE RODS  Final   Report Status PENDING  Incomplete  Blood Culture ID Panel (Reflexed)     Status: None   Collection Time: 02/28/17  4:35 PM  Result Value Ref Range Status   Enterococcus species NOT DETECTED NOT DETECTED Final    Comment:  NONE DETECTED BY [facility] 03/04/2017 AT 2:44 AM   Listeria monocytogenes NOT DETECTED NOT  DETECTED Final   Staphylococcus species NOT DETECTED NOT DETECTED Final   Staphylococcus aureus NOT DETECTED NOT DETECTED Final   Streptococcus species NOT DETECTED NOT DETECTED Final   Streptococcus agalactiae NOT DETECTED NOT DETECTED Final   Streptococcus pneumoniae NOT DETECTED NOT DETECTED Final   Streptococcus pyogenes NOT DETECTED NOT DETECTED Final   Acinetobacter baumannii NOT DETECTED NOT DETECTED Final   Enterobacteriaceae species NOT DETECTED NOT DETECTED Final   Enterobacter cloacae complex NOT DETECTED NOT DETECTED Final   Escherichia coli NOT DETECTED NOT DETECTED Final   Klebsiella oxytoca NOT DETECTED NOT DETECTED Final   Klebsiella pneumoniae NOT DETECTED NOT DETECTED Final   Proteus species NOT DETECTED NOT DETECTED Final   Serratia marcescens NOT DETECTED NOT DETECTED Final   Haemophilus influenzae NOT DETECTED NOT DETECTED Final   Neisseria meningitidis NOT DETECTED NOT DETECTED Final   Pseudomonas aeruginosa NOT DETECTED NOT DETECTED Final   Candida albicans NOT DETECTED NOT DETECTED Final   Candida glabrata NOT DETECTED NOT DETECTED Final   Candida krusei NOT DETECTED NOT DETECTED Final   Candida parapsilosis NOT DETECTED NOT DETECTED Final   Candida tropicalis NOT DETECTED NOT DETECTED Final    Comment: Performed at  [facility], [place] [address], [place], [place] [address]  MRSA PCR Screening     Status: None   Collection Time: 02/28/17  9:50 PM  Result Value Ref Range Status   MRSA by PCR NEGATIVE NEGATIVE Final    Comment:        The GeneXpert MRSA Assay (FDA approved for NASAL specimens only), is one component of a comprehensive MRSA colonization surveillance program. It is not intended to diagnose MRSA infection nor to guide or monitor treatment for MRSA infections.   C difficile quick scan w PCR reflex     Status: None   Collection Time: 03/02/17 12:10 AM  Result Value Ref Range Status   C Diff antigen NEGATIVE NEGATIVE Final   C Diff toxin NEGATIVE NEGATIVE Final   C Diff interpretation No C. difficile detected.  Final  Gastrointestinal Panel by PCR , Stool     Status: None   Collection Time: 03/02/17 12:10 AM  Result Value Ref Range Status   Campylobacter species NOT DETECTED NOT DETECTED Final   Plesimonas shigelloides NOT DETECTED NOT DETECTED Final   Salmonella species NOT DETECTED NOT DETECTED Final   Yersinia enterocolitica NOT DETECTED NOT DETECTED Final   Vibrio species NOT DETECTED NOT DETECTED Final   Vibrio cholerae NOT DETECTED NOT DETECTED Final   Enteroaggregative E coli (EAEC) NOT DETECTED NOT DETECTED Final   Enteropathogenic E coli (EPEC) NOT DETECTED NOT DETECTED Final   Enterotoxigenic E coli (ETEC) NOT DETECTED NOT DETECTED Final   Shiga like toxin producing E coli (STEC) NOT DETECTED NOT DETECTED Final   Shigella/Enteroinvasive E coli (EIEC) NOT DETECTED NOT DETECTED Final   Cryptosporidium NOT DETECTED NOT DETECTED Final   Cyclospora cayetanensis NOT DETECTED NOT DETECTED Final   Entamoeba histolytica NOT DETECTED NOT DETECTED Final   Giardia lamblia NOT DETECTED NOT DETECTED Final   Adenovirus F40/41 NOT DETECTED NOT DETECTED Final   Astrovirus NOT DETECTED NOT DETECTED Final   Norovirus GI/GII NOT DETECTED NOT DETECTED Final   Rotavirus A NOT DETECTED  NOT DETECTED Final   Sapovirus (I, II, IV, and V) NOT DETECTED NOT DETECTED Final     Studies: Dg Abd 2 Views  Result Date: 03/03/2017 CLINICAL DATA:  Pt c/o bilateral abd "soreness," admitted x 3  days ago for diverticulits. ? "micro perforations" per patient EXAM: ABDOMEN - 2 VIEW COMPARISON:  CT, 02/28/2017 FINDINGS: No free air. There are scattered air-fluid levels on the erect view in both small bowel and colon without significant bowel dilation. This is most consistent with an adynamic ileus. No convincing bowel obstruction. Soft tissues and skeletal structures are unremarkable. IMPRESSION: 1. No free air. 2. No evidence of bowel obstruction. 3. Small bowel and colonic air-fluid levels consistent with an adynamic ileus. Electronically Signed   By: Lajean Manes M.D.   On: 03/03/2017 11:53    Scheduled Meds: . enoxaparin (LOVENOX) injection  40 mg Subcutaneous QHS  . nicotine  21 mg Transdermal Daily  . saccharomyces boulardii  250 mg Oral BID  . sodium chloride flush  3 mL Intravenous Q12H   Continuous Infusions: . sodium chloride 50 mL/hr at 03/04/17 1449  . meropenem (MERREM) IV 1 g (03/04/17 1819)    Time spent: 25 minutes    Barton Dubois  [facility] Pager [phone]. If 7PM-7AM, please contact night-coverage at [email or web address], password [facility] 03/04/2017, 6:27 PM  LOS: 4 days

## 2017-03-04 NOTE — Progress Notes (Signed)
Pharmacy Antibiotic Note  Md Smola is a 56 y.o. male admitted on 02/28/2017 with abdominal pain. Pt has a history of diverticulitis and has completed courses of cipro/flagyl over the past couple months. Originally started on Aztreonam/Levaquin/Vancomycin in ED, but will change to Meropenem per pharmacy for sepsis/intra-abdominal course with PCN allergy as discussed with Dr Dyann Kief.    03/04/2017:  D4 antibiotics  afebrile  WBC now WNL  LA, PCT now WNL  Renal function at baseline, CrCl >123ml/min  Plan: -continue Meropenem 1gm IV q8h -Follow cx data, patient progress  Height: 5\' 10"  (177.8 cm) Weight: 220 lb 7.4 oz (100 kg) IBW/kg (Calculated) : 73  Temp (24hrs), Avg:98.4 F (36.9 C), Min:98 F (36.7 C), Max:98.7 F (37.1 C)   Recent Labs Lab 02/28/17 1604 02/28/17 1617 02/28/17 1945 03/01/17 0033 03/01/17 0353 03/02/17 0355 03/03/17 0415 03/04/17 0431  WBC 16.3*  --   --   --  16.5* 9.9 12.5* 9.5  CREATININE 1.14  --   --   --  0.99 0.98 0.86 0.75  LATICACIDVEN  --  5.80* 2.22* 2.2*  --   --   --  0.8    Estimated Creatinine Clearance: 125.1 mL/min (by C-G formula based on SCr of 0.75 mg/dL).    Allergies  Allergen Reactions  . Penicillins     rash    Antimicrobials this admission: 4/19 vancomycin >4/20 4/19 aztreonam >4/20 4/19 levaquin >4/20 4/20 Meropenem>>  Dose adjustments this admission: N/A  Microbiology results: 4/19 blood cx: gram + rods x 1 anerobic bottle> possible contaminant 4/19 urine cx: sent 4/19 MRSA PCR: negative  Dolly Rias RPh 03/04/2017, 9:41 AM Pager [phone]

## 2017-03-04 NOTE — Progress Notes (Signed)
PHARMACY - PHYSICIAN COMMUNICATION CRITICAL VALUE ALERT - BLOOD CULTURE IDENTIFICATION (BCID)  No results found for this or any previous visit.  Gm + Rods x1 in anerobic bottle  Name of physician (or Provider) Contacted: K Schorr  Changes to prescribed antibiotics required: Possible contaminant.  Pt on Meropenem, no change needed  Dorrene German 03/04/2017  2:50 AM

## 2017-03-05 DIAGNOSIS — R109 Unspecified abdominal pain: Secondary | ICD-10-CM

## 2017-03-05 DIAGNOSIS — K5732 Diverticulitis of large intestine without perforation or abscess without bleeding: Secondary | ICD-10-CM

## 2017-03-05 LAB — CULTURE, BLOOD (ROUTINE X 2)
Culture: NO GROWTH
Special Requests: ADEQUATE

## 2017-03-05 MED ORDER — LEVOFLOXACIN 750 MG PO TABS: 750.0000 mg | ORAL_TABLET | Freq: Every day | ORAL | 0 refills | Status: DC

## 2017-03-05 MED ORDER — METRONIDAZOLE 500 MG PO TABS: 500.0000 mg | ORAL_TABLET | Freq: Three times a day (TID) | ORAL | 0 refills | Status: DC

## 2017-03-05 MED ORDER — NICOTINE 21 MG/24HR TD PT24: 21.0000 mg | MEDICATED_PATCH | Freq: Every day | TRANSDERMAL | 0 refills | Status: DC

## 2017-03-05 MED ORDER — SACCHAROMYCES BOULARDII 250 MG PO CAPS: 250.0000 mg | ORAL_CAPSULE | Freq: Two times a day (BID) | ORAL | 0 refills | Status: DC

## 2017-03-05 MED ORDER — LISINOPRIL-HYDROCHLOROTHIAZIDE 20-25 MG PO TABS: 0.5000 | ORAL_TABLET | Freq: Every day | ORAL | Status: AC

## 2017-03-05 NOTE — Discharge Summary (Signed)
Physician Discharge Summary  Dakota Santos TML:[id number] DOB: 1960/12/21 DOA: 02/28/2017  PCP: [name], MD  Admit date: 02/28/2017 Discharge date: 03/05/2017  Time spent: 35 minutes  Recommendations for Outpatient Follow-up:  1. Repeat BMET to follow electrolytes and renal function  2. Reassess BP and adjust medications as needed  3. Patient will need repeat colonoscopy and depending results follow up with general surgery as an outpatient for elective hemicolectomy  4. Please repeat outpatient CT chest to follow lung nodule as recommended by radiology service.    Discharge Diagnoses:  Principal Problem:   Sepsis ([place]) Active Problems:   Diverticulitis   Lung nodule seen on imaging study   Essential hypertension   Hyperlipidemia   Tobacco dependence   Abdominal pain   Diverticulitis of large intestine without perforation or abscess without bleeding   Discharge Condition: stable and improved. Discharge home with instructions to follow up with PCP in 2 weeks and with GI service in 3-4 weeks.  Diet recommendation: heart healthy/low residue diet   Filed Weights   02/28/17 1431 02/28/17 2135 03/02/17 1227  Weight: 95.3 kg (210 lb) 99.6 kg (219 lb 9.3 oz) 100 kg (220 lb 7.4 oz)    History of present illness:  56 y.o.malewith medical history significant of HTN, HLD, and diverticulitis (per patient has been experiencing LLQ pain for the last 5-6 years); with 3rd episode of diverticulitis flare in this year.  Patient found to meet sepsis criteria on admission with (fever, tachycardia, elevated lactic acid, tachypnea and leukocytosis).  Hospital Course:  1-Sepsis due to diverticulitis/diarrhea -no perforation or abscess seen on CT scan on admission or abnormalities seen on repeat abd x-ray. -patient is now afebrile and denies any nausea and vomiting -sepsis features completely resolved -will discharge home with 9 more days of antibiotics; using now levaquin and  flagyl. -follow up with GI service as an outpatient -advise to maintain adequate hydration and to continue PRN analgesics -case discussed with Dr. Michail Sermon (GI service), no further recommendations provided currently. Outpatient follow up with Dr. Paulita Fujita and eventually will need elective hemicolectomy.  2-Lung Nodule -given hx of smoking, will need repeat CT scan on 7/18 -further work up as an outpatient as needed  -patient encouraged to stop smoking  -no current red flags symptoms reported by patient   3-HTN -BP improved/rising -will resume home medication regimen (just half dose for now) -follow up with PCP for further adjustments base on his BP fluctuation.  4-HLD -will resume Zetia  5-Tobacco abuse -cessation counseling encouraged; discussed the use of 1-800-QUIT-NOW -will discharge on Nicotine patch  6-Lactic acidosis  -due to sepsis -lactic acid level 0.8 at discharge -resolved with IVF's  7-1/2 bacteremia: -maybe contaminant  -continue current antibiotics  -clinically improved and w/o demonstrating signs of systemic infection at discharge.   Procedures:  See below for x-ray reports   Consultations:  GI (Dr. Michail Sermon curbside and case discussed; no further recommendations currently. Continue current treatment for diverticulitis)  Discharge Exam: Vitals:   03/04/17 2101 03/05/17 0430  BP: 127/83 118/69  Pulse: 64 66  Resp: 18 16  Temp: 98.5 F (36.9 C) 98 F (36.7 C)    General:  Patient is afebrile and feeling a lot better. No nausea, no vomiting. Reporting just minimal discomfort in his LLQ. Tolerating diet and medications by mouth.  Cardiovascular: regular rate, no rubs or gallops, no murmurs; no JVD  Respiratory: CTA bilaterally  Abdomen: soft, no guarding, mild tenderness on his LLQ. No rebound. Positive BS  Musculoskeletal: no cyanosis or clubbing, no edema appreciated.   Discharge Instructions   Discharge Instructions    Diet - low  sodium heart healthy    Complete by:  As directed    Discharge instructions    Complete by:  As directed    Keep yourself well hydrated Take medications as prescribed  Stop smoking Follow heart healthy/low residue diet  Follow up with PCP in 10-14 days Arrange follow up with gastroenterologist in 3-4 weeks     Current Discharge Medication List    START taking these medications   Details  levofloxacin (LEVAQUIN) 750 MG tablet Take 1 tablet (750 mg total) by mouth daily. Qty: 9 tablet, Refills: 0    metroNIDAZOLE (FLAGYL) 500 MG tablet Take 1 tablet (500 mg total) by mouth 3 (three) times daily. Qty: 27 tablet, Refills: 0    nicotine (NICODERM CQ - DOSED IN MG/24 HOURS) 21 mg/24hr patch Place 1 patch (21 mg total) onto the skin daily. Qty: 28 patch, Refills: 0    saccharomyces boulardii (FLORASTOR) 250 MG capsule Take 1 capsule (250 mg total) by mouth 2 (two) times daily. Qty: 30 capsule, Refills: 0      CONTINUE these medications which have CHANGED   Details  lisinopril-hydrochlorothiazide (PRINZIDE,ZESTORETIC) 20-25 MG tablet Take 0.5 tablets by mouth daily.      CONTINUE these medications which have NOT CHANGED   Details  aspirin EC 81 MG tablet Take 81 mg by mouth daily.    ezetimibe (ZETIA) 10 MG tablet Take 10 mg by mouth daily.    HYDROcodone-acetaminophen (NORCO) 5-325 MG tablet Take 1 tablet by mouth every 6 (six) hours as needed for severe pain. Qty: 10 tablet, Refills: 0      STOP taking these medications     dicyclomine (BENTYL) 10 MG capsule        Allergies  Allergen Reactions  . Penicillins     rash   Follow-up Information    [name], MD. Schedule an appointment as soon as possible for a visit in 55 day(s).   Specialty:  Family Medicine Contact information: [address] [place] [address] [phone]        [name], MD. Schedule an appointment as soon as possible for a visit in 3 week(s).    Specialty:  Gastroenterology Contact information: [address] [place] [address] [phone]           The results of significant diagnostics from this hospitalization (including imaging, microbiology, ancillary and laboratory) are listed below for reference.    Significant Diagnostic Studies: Ct Abdomen Pelvis W Contrast  Result Date: 02/28/2017 CLINICAL DATA:  Intermittent lower abdominal pain for several years, worse since March 2018. Chills, fever and nausea today. EXAM: CT ABDOMEN AND PELVIS WITH CONTRAST TECHNIQUE: Multidetector CT imaging of the abdomen and pelvis was performed using the standard protocol following bolus administration of intravenous contrast. CONTRAST:  171mL ISOVUE-300 IOPAMIDOL (ISOVUE-300) INJECTION 61% COMPARISON:  Previous examinations, the most recent dated 01/25/2017. FINDINGS: Lower chest: A partially included sub solid nodular density is demonstrated in the left lower lobe on the first image. This measures 1.3 cm in maximum diameter and measured 1.5 cm in maximum diameter on 12/25/2016, without significant change in appearance. Hepatobiliary: Mild diffuse low density of the liver relative to the spleen. Normal appearing gallbladder. Pancreas: Unremarkable. No pancreatic ductal dilatation or surrounding inflammatory changes. Spleen: Normal in size without focal abnormality. Adrenals/Urinary Tract: Adrenal glands are  unremarkable. Small bilateral renal cysts without renal calculi or hydronephrosis. Bladder is unremarkable. Stomach/Bowel: Multiple sigmoid colon diverticula. Pericolonic soft tissue stranding adjacent to some of the diverticula in the proximal sigmoid colon. No fluid collections or free peritoneal air. Unremarkable stomach, small bowel and appendix. Vascular/Lymphatic: Minimal atheromatous arterial calcifications. No enlarged lymph nodes. Reproductive: Mildly enlarged prostate gland. Other: Small bilateral inguinal hernias  containing fat. Tiny umbilical hernia containing fat. Musculoskeletal: Lumbar and lower thoracic spine degenerative changes. IMPRESSION: 1. Sigmoid colon diverticulosis and proximal diverticulitis without abscess. 2. Mild diffuse hepatic steatosis. 3. Grossly stable partially included left lower lobe subsolid nodule. A follow-up chest CT without contrast is recommended in 3 months. Electronically Signed   By: Claudie Revering M.D.   On: 02/28/2017 18:35   Dg Chest Port 1 View  Result Date: 02/28/2017 CLINICAL DATA:  Fever and chills. EXAM: PORTABLE CHEST 1 VIEW COMPARISON:  None. FINDINGS: 1558 hours. Lungs are hyperexpanded. The lungs are clear wiithout focal pneumonia, edema, pneumothorax or pleural effusion. The cardiopericardial silhouette is within normal limits for size. The visualized bony structures of the thorax are intact. Telemetry leads overlie the chest. IMPRESSION: Hyperexpansion without acute cardiopulmonary findings. Electronically Signed   By: Misty Stanley M.D.   On: 02/28/2017 16:24   Dg Abd 2 Views  Result Date: 03/03/2017 CLINICAL DATA:  Pt c/o bilateral abd "soreness," admitted x 3 days ago for diverticulits. ? "micro perforations" per patient EXAM: ABDOMEN - 2 VIEW COMPARISON:  CT, 02/28/2017 FINDINGS: No free air. There are scattered air-fluid levels on the erect view in both small bowel and colon without significant bowel dilation. This is most consistent with an adynamic ileus. No convincing bowel obstruction. Soft tissues and skeletal structures are unremarkable. IMPRESSION: 1. No free air. 2. No evidence of bowel obstruction. 3. Small bowel and colonic air-fluid levels consistent with an adynamic ileus. Electronically Signed   By: Lajean Manes M.D.   On: 03/03/2017 11:53    Microbiology: Recent Results (from the past 240 hour(s))  Urine culture     Status: None   Collection Time: 02/28/17  4:04 PM  Result Value Ref Range Status   Specimen Description URINE, RANDOM  Final    Special Requests NONE  Final   Culture   Final    NO GROWTH Performed at [facility], [address], [place], [place] [address]    Report Status 03/01/2017 FINAL  Final  Blood Culture (routine x 2)     Status: None   Collection Time: 02/28/17  4:25 PM  Result Value Ref Range Status   Specimen Description BLOOD BLOOD RIGHT HAND  Final   Special Requests   Final    BOTTLES DRAWN AEROBIC AND ANAEROBIC Blood Culture adequate volume   Culture   Final    NO GROWTH 5 DAYS Performed at [facility], [place] [address] [place], [place] [address]    Report Status 03/05/2017 FINAL  Final  Blood Culture (routine x 2)     Status: None (Preliminary result)   Collection Time: 02/28/17  4:35 PM  Result Value Ref Range Status   Specimen Description BLOOD BLOOD LEFT HAND  Final   Special Requests   Final    BOTTLES DRAWN AEROBIC AND ANAEROBIC Blood Culture results may not be optimal due to an inadequate volume of blood received in culture bottles   Culture  Setup Time   Final    ANAEROBIC BOTTLE ONLY GRAM POSITIVE RODS Organism ID to  follow CRITICAL RESULT CALLED TO, READ BACK BY AND VERIFIED WITH: TO BGREEN(PHARMd) BY [facility] 03/04/2017 AT 2:47AM    Culture   Final    GRAM POSITIVE RODS IDENTIFICATION TO FOLLOW Performed at [facility], [place] [address], [place], [place] [address]    Report Status PENDING  Incomplete  Blood Culture ID Panel (Reflexed)     Status: None   Collection Time: 02/28/17  4:35 PM  Result Value Ref Range Status   Enterococcus species NOT DETECTED NOT DETECTED Final    Comment:  NONE DETECTED BY [facility] 03/04/2017 AT 2:44 AM   Listeria monocytogenes NOT DETECTED NOT DETECTED Final   Staphylococcus species NOT DETECTED NOT DETECTED Final   Staphylococcus aureus NOT DETECTED NOT DETECTED Final   Streptococcus species NOT DETECTED NOT DETECTED Final   Streptococcus agalactiae NOT DETECTED NOT DETECTED Final   Streptococcus pneumoniae NOT DETECTED NOT  DETECTED Final   Streptococcus pyogenes NOT DETECTED NOT DETECTED Final   Acinetobacter baumannii NOT DETECTED NOT DETECTED Final   Enterobacteriaceae species NOT DETECTED NOT DETECTED Final   Enterobacter cloacae complex NOT DETECTED NOT DETECTED Final   Escherichia coli NOT DETECTED NOT DETECTED Final   Klebsiella oxytoca NOT DETECTED NOT DETECTED Final   Klebsiella pneumoniae NOT DETECTED NOT DETECTED Final   Proteus species NOT DETECTED NOT DETECTED Final   Serratia marcescens NOT DETECTED NOT DETECTED Final   Haemophilus influenzae NOT DETECTED NOT DETECTED Final   Neisseria meningitidis NOT DETECTED NOT DETECTED Final   Pseudomonas aeruginosa NOT DETECTED NOT DETECTED Final   Candida albicans NOT DETECTED NOT DETECTED Final   Candida glabrata NOT DETECTED NOT DETECTED Final   Candida krusei NOT DETECTED NOT DETECTED Final   Candida parapsilosis NOT DETECTED NOT DETECTED Final   Candida tropicalis NOT DETECTED NOT DETECTED Final    Comment: Performed at [facility], [place] [address] [place], [place] [address]  MRSA PCR Screening     Status: None   Collection Time: 02/28/17  9:50 PM  Result Value Ref Range Status   MRSA by PCR NEGATIVE NEGATIVE Final    Comment:        The GeneXpert MRSA Assay (FDA approved for NASAL specimens only), is one component of a comprehensive MRSA colonization surveillance program. It is not intended to diagnose MRSA infection nor to guide or monitor treatment for MRSA infections.   C difficile quick scan w PCR reflex     Status: None   Collection Time: 03/02/17 12:10 AM  Result Value Ref Range Status   C Diff antigen NEGATIVE NEGATIVE Final   C Diff toxin NEGATIVE NEGATIVE Final   C Diff interpretation No C. difficile detected.  Final  Gastrointestinal Panel by PCR , Stool     Status: None   Collection Time: 03/02/17 12:10 AM  Result Value Ref Range Status   Campylobacter species NOT DETECTED NOT DETECTED Final   Plesimonas  shigelloides NOT DETECTED NOT DETECTED Final   Salmonella species NOT DETECTED NOT DETECTED Final   Yersinia enterocolitica NOT DETECTED NOT DETECTED Final   Vibrio species NOT DETECTED NOT DETECTED Final   Vibrio cholerae NOT DETECTED NOT DETECTED Final   Enteroaggregative E coli (EAEC) NOT DETECTED NOT DETECTED Final   Enteropathogenic E coli (EPEC) NOT DETECTED NOT DETECTED Final   Enterotoxigenic E coli (ETEC) NOT DETECTED NOT DETECTED Final   Shiga like toxin producing E coli (STEC) NOT DETECTED NOT DETECTED Final   Shigella/Enteroinvasive E coli (EIEC) NOT DETECTED  NOT DETECTED Final   Cryptosporidium NOT DETECTED NOT DETECTED Final   Cyclospora cayetanensis NOT DETECTED NOT DETECTED Final   Entamoeba histolytica NOT DETECTED NOT DETECTED Final   Giardia lamblia NOT DETECTED NOT DETECTED Final   Adenovirus F40/41 NOT DETECTED NOT DETECTED Final   Astrovirus NOT DETECTED NOT DETECTED Final   Norovirus GI/GII NOT DETECTED NOT DETECTED Final   Rotavirus A NOT DETECTED NOT DETECTED Final   Sapovirus (I, II, IV, and V) NOT DETECTED NOT DETECTED Final     Labs: Basic Metabolic Panel:  Recent Labs Lab 02/28/17 1604 03/01/17 0353 03/02/17 0355 03/03/17 0415 03/04/17 0431  NA 134* 135 135 136 137  K 3.8 3.7 3.9 3.5 3.5  CL 101 103 108 107 110  CO2 20* 21* 20* 20* 19*  GLUCOSE 95 75 92 103* 99  BUN 12 11 8 6 6   CREATININE 1.14 0.99 0.98 0.86 0.75  CALCIUM 9.0 8.4* 7.9* 8.2* 8.4*   Liver Function Tests:  Recent Labs Lab 02/28/17 1604  AST 39  ALT 42  ALKPHOS 63  BILITOT 1.2  PROT 7.4  ALBUMIN 4.2    Recent Labs Lab 02/28/17 1604  LIPASE 24   CBC:  Recent Labs Lab 02/28/17 1604 03/01/17 0353 03/02/17 0355 03/03/17 0415 03/04/17 0431  WBC 16.3* 16.5* 9.9 12.5* 9.5  NEUTROABS 14.8*  --   --   --   --   HGB 18.0* 15.2 13.6 13.9 14.3  HCT 51.6 43.5 40.6 40.1 40.9  MCV 88.4 89.1 89.4 88.1 87.6  PLT 256 242 216 246 261   BNP (last 3 results)  Recent  Labs  02/28/17 1604  BNP 15.3    Signed:  Barton Dubois MD.  [facility] 03/05/2017, 11:12 AM

## 2017-03-06 LAB — CULTURE, BLOOD (ROUTINE X 2)

## 2017-03-14 DIAGNOSIS — K5792 Diverticulitis of intestine, part unspecified, without perforation or abscess without bleeding: Secondary | ICD-10-CM | POA: Diagnosis not present

## 2017-03-14 DIAGNOSIS — A419 Sepsis, unspecified organism: Secondary | ICD-10-CM | POA: Diagnosis not present

## 2017-03-19 DIAGNOSIS — Z86010 Personal history of colonic polyps: Secondary | ICD-10-CM | POA: Diagnosis not present

## 2017-03-19 DIAGNOSIS — K5732 Diverticulitis of large intestine without perforation or abscess without bleeding: Secondary | ICD-10-CM | POA: Diagnosis not present

## 2017-04-01 ENCOUNTER — Other Ambulatory Visit: Payer: Self-pay | Admitting: Surgery

## 2017-04-01 DIAGNOSIS — K5732 Diverticulitis of large intestine without perforation or abscess without bleeding: Secondary | ICD-10-CM | POA: Diagnosis not present

## 2017-04-01 MED ORDER — CLINDAMYCIN PHOSPHATE 600 MG/4ML IJ SOLN
900.0000 mg | INTRAMUSCULAR | Status: AC
  Administered 2017-05-17: 900 mg via INTRAVENOUS

## 2017-04-01 MED ORDER — GENTAMICIN SULFATE 40 MG/ML IJ SOLN
5.0000 mg/kg | INTRAVENOUS | Status: AC
  Administered 2017-05-17: 500 mg via INTRAVENOUS

## 2017-04-22 DIAGNOSIS — I10 Essential (primary) hypertension: Secondary | ICD-10-CM | POA: Diagnosis not present

## 2017-04-22 DIAGNOSIS — E7800 Pure hypercholesterolemia, unspecified: Secondary | ICD-10-CM | POA: Diagnosis not present

## 2017-04-22 DIAGNOSIS — K5792 Diverticulitis of intestine, part unspecified, without perforation or abscess without bleeding: Secondary | ICD-10-CM | POA: Diagnosis not present

## 2017-05-08 ENCOUNTER — Encounter (HOSPITAL_COMMUNITY)
Admission: RE | Admit: 2017-05-08 | Discharge: 2017-05-08 | Disposition: A | Discharge status: Home / Self Care | Payer: 59 | Source: Ambulatory Visit | Attending: Surgery | Admitting: Surgery

## 2017-05-08 ENCOUNTER — Encounter (HOSPITAL_COMMUNITY): Payer: Self-pay

## 2017-05-08 DIAGNOSIS — K5732 Diverticulitis of large intestine without perforation or abscess without bleeding: Secondary | ICD-10-CM | POA: Insufficient documentation

## 2017-05-08 DIAGNOSIS — Z01818 Encounter for other preprocedural examination: Secondary | ICD-10-CM | POA: Diagnosis not present

## 2017-05-08 LAB — CBC
HCT: 46.9 % (ref 39.0–52.0)
Hemoglobin: 16.3 g/dL (ref 13.0–17.0)
MCH: 30.5 pg (ref 26.0–34.0)
MCHC: 34.8 g/dL (ref 30.0–36.0)
MCV: 87.7 fL (ref 78.0–100.0)
Platelets: 313 10*3/uL (ref 150–400)
RBC: 5.35 MIL/uL (ref 4.22–5.81)
RDW: 13.6 % (ref 11.5–15.5)
WBC: 9.9 10*3/uL (ref 4.0–10.5)

## 2017-05-08 LAB — BASIC METABOLIC PANEL
Anion gap: 8 (ref 5–15)
BUN: 11 mg/dL (ref 6–20)
CO2: 24 mmol/L (ref 22–32)
Calcium: 9.9 mg/dL (ref 8.9–10.3)
Chloride: 105 mmol/L (ref 101–111)
Creatinine, Ser: 1.09 mg/dL (ref 0.61–1.24)
GFR calc Af Amer: >60 mL/min (ref >60)
GFR calc non Af Amer: >60 mL/min (ref >60)
Glucose, Bld: 96 mg/dL (ref 65–99)
Potassium: 3.6 mmol/L (ref 3.5–5.1)
Sodium: 137 mmol/L (ref 135–145)

## 2017-05-08 NOTE — Pre-Procedure Instructions (Signed)
Dakota Santos  05/08/2017      [facility] [address] [place], [place] [address] Phone: [phone] Fax: [phone]  [facility] [address] [place], [place] [address] [place] [address] Phone: [phone] Fax: [phone]    Your procedure is scheduled on July 6,2018  Friday .  Report to [facility] Admitting at 11:00 AM .  Call this number if you have problems the morning of surgery:  [phone]      Remember:  Do not eat food or drink liquids after midnight.    Take these medicines the morning of surgery with A SIP OF WATER Zetia,Flonase nasal spray  STOP ASPIRIN,ANTIINFLAMATORIES (IBUPROFEN,ALEVE,MOTRIN,ADVIL,GOODY'S POWDERS),HERBAL SUPPLEMENTS,FISH OIL,AND VITAMINS 5-7 DAYS PRIOR TO SURGERY    Do not wear jewelry, make-up or nail polish.  Do not wear lotions, powders, or perfumes, or deoderant.  Do not shave 48 hours prior to surgery.  Men may shave face and neck.  Do not bring valuables to the hospital.  [facility] is not responsible for any belongings or valuables.  Contacts, dentures or bridgework may not be worn into surgery.  Leave your suitcase in the car.  After surgery it may be brought to your room.  For patients admitted to the hospital, discharge time will be determined by your treatment team.  Patients discharged the day of surgery will not be allowed to drive home.    Special Instructions: [place] - Preparing for Surgery  Before surgery, you can play an important role.  Because skin is not sterile, your skin needs to be as free of germs as possible.  You can reduce the number of germs on you skin by washing with CHG (chlorahexidine gluconate) soap before surgery.  CHG is an antiseptic cleaner which kills germs and bonds with the skin to continue killing germs even after washing.  Please DO NOT use if you have an allergy  to CHG or antibacterial soaps.  If your skin becomes reddened/irritated stop using the CHG and inform your nurse when you arrive at Short Stay.  Do not shave (including legs and underarms) for at least 48 hours prior to the first CHG shower.  You may shave your face.  Please follow these instructions carefully:   1.  Shower with CHG Soap the night before surgery and the   morning of Surgery.  2.  If you choose to wash your hair, wash your hair first as usual with your normal shampoo.  3.  After you shampoo, rinse your hair and body thoroughly to remove the  Shampoo.  4.  Use CHG as you would any other liquid soap.  You can apply chg directly  to the skin and wash gently with scrungie or a clean washcloth.  5.  Apply the CHG Soap to your body ONLY FROM THE NECK DOWN.   Do not use on open wounds or open sores.  Avoid contact with your eyes,  ears, mouth and genitals (private parts).  Wash genitals (private parts) with your normal soap.  6.  Wash thoroughly, paying special attention to the area where your surgery will be performed.  7.  Thoroughly rinse your body with warm water from the neck down.  8.  DO NOT shower/wash with your normal soap after using and rinsing o  the CHG Soap.  9.  Pat yourself dry with a clean towel.  10.  Wear clean pajamas.            11.  Place clean sheets on your bed the night of your first shower and do not sleep with pets.  Day of Surgery  Do not apply any lotions/deodorants the morning of surgery.  Please wear clean clothes to the hospital/surgery center.   Please read over the following fact sheets that you were given. Coughing and Deep Breathing and Surgical Site Infection Prevention

## 2017-05-09 LAB — HEMOGLOBIN A1C
Hgb A1c MFr Bld: 6 % — ABNORMAL HIGH (ref 4.8–5.6)
Mean Plasma Glucose: 126 mg/dL

## 2017-05-16 NOTE — H&P (Signed)
Dakota Santos  Location: [facility] Patient #: [id number] DOB: 1961-07-10 Married / Language: Dakota Santos / Race: White Male   History of Present Illness Dakota Santos A. Ninfa Linden MD;  Patient words: New-Sigmoid diverticulits.  The patient is a 56 year old male who presents with diverticulitis. This is a pleasant gentleman referred by Dr. Arta Silence for evaluation of sigmoid diverticulitis. The patient has been having bouts of diverticulitis for approximate 5 years. He has had 3 significant attacks since January. He had to be hospitalized for IV antibiotic for one. His CT scans have always showed diverticulitis but no evidence of microperforation. He had a colonoscopy slightly more year ago showing diverticuli. He is otherwise without complaints. He still has spasmodic occasional mild to moderate left lower quadrant and flank pain. He denies fevers. Bowel movements are fairly normal. He is otherwise healthy and without complaints.   Past Surgical History [name], CMA;  Colon Polyp Removal - Colonoscopy   Diagnostic Studies History [name], CMA;  Colonoscopy  1-5 years ago  Allergies [name], CMA;  Penicillin G Sodium *PENICILLINS*   Medication History ([name], CMA;   Ezetimibe (10MG  Tablet, Oral) Active. Lisinopril-Hydrochlorothiazide (20-25MG  Tablet, Oral) Active. Medications Reconciled  Social History [name], CMA;  Alcohol use  Occasional alcohol use. Caffeine use  Carbonated beverages, Coffee, Tea. Illicit drug use  Remotely quit drug use. Tobacco use  Former smoker.  Family History [name], CMA Colon Polyps  Father. Hypertension  Father. Prostate Cancer  Father.  Other Problems [name], CMA; Diverticulosis  Hemorrhoids  High blood pressure  Hypercholesterolemia  Umbilical Hernia Repair     Review of Systems   General Not Present- Appetite Loss, Chills, Fatigue, Fever, Night  Sweats, Weight Gain and Weight Loss. Skin Not Present- Change in Wart/Mole, Dryness, Hives, Jaundice, New Lesions, Non-Healing Wounds, Rash and Ulcer. HEENT Not Present- Earache, Hearing Loss, Hoarseness, Nose Bleed, Oral Ulcers, Ringing in the Ears, Seasonal Allergies, Sinus Pain, Sore Throat, Visual Disturbances, Wears glasses/contact lenses and Yellow Eyes. Respiratory Not Present- Bloody sputum, Chronic Cough, Difficulty Breathing, Snoring and Wheezing. Breast Not Present- Breast Mass, Breast Pain, Nipple Discharge and Skin Changes. Cardiovascular Not Present- Chest Pain, Difficulty Breathing Lying Down, Leg Cramps, Palpitations, Rapid Heart Rate, Shortness of Breath and Swelling of Extremities. Gastrointestinal Present- Abdominal Pain. Not Present- Bloating, Bloody Stool, Change in Bowel Habits, Chronic diarrhea, Constipation, Difficulty Swallowing, Excessive gas, Gets full quickly at meals, Hemorrhoids, Indigestion, Nausea, Rectal Pain and Vomiting. Male Genitourinary Not Present- Blood in Urine, Change in Urinary Stream, Frequency, Impotence, Nocturia, Painful Urination, Urgency and Urine Leakage. Musculoskeletal Not Present- Back Pain, Joint Pain, Joint Stiffness, Muscle Pain, Muscle Weakness and Swelling of Extremities. Neurological Not Present- Decreased Memory, Fainting, Headaches, Numbness, Seizures, Tingling, Tremor, Trouble walking and Weakness. Psychiatric Not Present- Anxiety, Bipolar, Change in Sleep Pattern, Depression, Fearful and Frequent crying. Endocrine Not Present- Cold Intolerance, Excessive Hunger, Hair Changes, Heat Intolerance, Hot flashes and New Diabetes. Hematology Not Present- Blood Thinners, Easy Bruising, Excessive bleeding, Gland problems, HIV and Persistent Infections.  Vitals   Weight: 219 lb Height: 70in Body Surface Area: 2.17 m Body Mass Index: 31.42 kg/m  Temp.: 98.76F(Oral)  Pulse: 72 (Regular)  BP: 122/78 (Sitting, Left Arm,  Standard)    Physical Exam  General Mental Status-Alert. General Appearance-Consistent with stated age. Hydration-Well hydrated. Voice-Normal.  Head and Neck Head-normocephalic, atraumatic with no lesions or palpable masses. Trachea-midline. Thyroid Gland Characteristics - normal size and consistency.  Eye Eyeball - Bilateral-Extraocular  movements intact. Sclera/Conjunctiva - Bilateral-No scleral icterus.  Chest and Lung Exam Chest and lung exam reveals -quiet, even and easy respiratory effort with no use of accessory muscles and on auscultation, normal breath sounds, no adventitious sounds and normal vocal resonance. Inspection Chest Wall - Normal. Back - normal.  Breast Breast - Left-Symmetric, Non Tender, No Biopsy scars, no Dimpling, No Inflammation, No Lumpectomy scars, No Mastectomy scars, No Peau d' Orange. Breast - Right-Symmetric, Non Tender, No Biopsy scars, no Dimpling, No Inflammation, No Lumpectomy scars, No Mastectomy scars, No Peau d' Orange. Breast Lump-No Palpable Breast Mass.  Cardiovascular Cardiovascular examination reveals -normal heart sounds, regular rate and rhythm with no murmurs and normal pedal pulses bilaterally.  Abdomen Inspection Inspection of the abdomen reveals - No Hernias. Skin - Scar - no surgical scars. Palpation/Percussion Palpation and Percussion of the abdomen reveal - Soft, No Rebound tenderness, No Rigidity (guarding) and No hepatosplenomegaly. Tenderness - Left Lower Quadrant. Auscultation Auscultation of the abdomen reveals - Bowel sounds normal.  Neurologic - Did not examine.  Musculoskeletal - Did not examine.  Lymphatic Head & Neck  General Head & Neck Lymphatics: Bilateral - Description - Normal. Axillary  General Axillary Region: Bilateral - Description - Normal. Tenderness - Non Tender. Femoral & Inguinal  Generalized Femoral & Inguinal Lymphatics: Bilateral - Description - Normal.  Tenderness - Non Tender.    Assessment & Plan   SIGMOID DIVERTICULITIS (K57.32)  Impression: I had a long discussion with the patient diagnosis. Because of his ongoing symptoms and recurrent attacks of sigmoid diverticulitis, laparoscopic assisted partial colectomy is recommended. He is eager to proceed with surgery. I discussed the surgical procedure in detail. I discussed the risks which includes but is not limited to bleeding, infection, injury to surrounding structures, anastomotic leak, cardiopulmonary issues, DVT, postoperative recovery, preoperative bowel preparation, etc. He understands and agrees to proceed with surgery

## 2017-05-17 ENCOUNTER — Inpatient Hospital Stay (HOSPITAL_COMMUNITY): Payer: 59 | Admitting: Anesthesiology

## 2017-05-17 ENCOUNTER — Inpatient Hospital Stay (HOSPITAL_COMMUNITY)
Admission: RE | Admit: 2017-05-17 | Discharge: 2017-05-21 | DRG: 331 | Disposition: A | Discharge status: Home / Self Care | Payer: 59 | Source: Ambulatory Visit | Attending: Surgery | Admitting: Surgery

## 2017-05-17 ENCOUNTER — Encounter (HOSPITAL_COMMUNITY)
Admission: RE | Disposition: A | Discharge status: Home / Self Care | Payer: Self-pay | Source: Ambulatory Visit | Attending: Surgery

## 2017-05-17 DIAGNOSIS — Z87891 Personal history of nicotine dependence: Secondary | ICD-10-CM | POA: Diagnosis not present

## 2017-05-17 DIAGNOSIS — I10 Essential (primary) hypertension: Secondary | ICD-10-CM | POA: Diagnosis present

## 2017-05-17 DIAGNOSIS — Z79899 Other long term (current) drug therapy: Secondary | ICD-10-CM | POA: Diagnosis not present

## 2017-05-17 DIAGNOSIS — K5720 Diverticulitis of large intestine with perforation and abscess without bleeding: Secondary | ICD-10-CM | POA: Diagnosis not present

## 2017-05-17 DIAGNOSIS — K5732 Diverticulitis of large intestine without perforation or abscess without bleeding: Principal | ICD-10-CM | POA: Diagnosis present

## 2017-05-17 DIAGNOSIS — Z8249 Family history of ischemic heart disease and other diseases of the circulatory system: Secondary | ICD-10-CM

## 2017-05-17 DIAGNOSIS — E785 Hyperlipidemia, unspecified: Secondary | ICD-10-CM | POA: Diagnosis not present

## 2017-05-17 HISTORY — PX: LAPAROSCOPIC SIGMOID COLECTOMY: SHX5928

## 2017-05-17 SURGERY — COLECTOMY, SIGMOID, LAPAROSCOPIC
Anesthesia: General | Site: Abdomen

## 2017-05-17 MED ORDER — ALVIMOPAN 12 MG PO CAPS
ORAL_CAPSULE | ORAL | Status: AC
  Filled 2017-05-17: qty 1

## 2017-05-17 MED ORDER — GENTAMICIN IN SALINE 1.6-0.9 MG/ML-% IV SOLN
INTRAVENOUS | Status: AC
  Filled 2017-05-17: qty 50

## 2017-05-17 MED ORDER — FENTANYL CITRATE (PF) 250 MCG/5ML IJ SOLN
INTRAMUSCULAR | Status: AC
  Filled 2017-05-17: qty 5

## 2017-05-17 MED ORDER — METOCLOPRAMIDE HCL 5 MG/ML IJ SOLN: 10.0000 mg | Freq: Once | INTRAMUSCULAR | Status: DC | PRN

## 2017-05-17 MED ORDER — CHLORHEXIDINE GLUCONATE CLOTH 2 % EX PADS: 6.0000 | MEDICATED_PAD | Freq: Once | CUTANEOUS | Status: DC

## 2017-05-17 MED ORDER — ALVIMOPAN 12 MG PO CAPS
12.0000 mg | ORAL_CAPSULE | ORAL | Status: AC
  Administered 2017-05-17: 12 mg via ORAL

## 2017-05-17 MED ORDER — ONDANSETRON HCL 4 MG/2ML IJ SOLN
INTRAMUSCULAR | Status: DC | PRN
  Administered 2017-05-17: 4 mg via INTRAVENOUS

## 2017-05-17 MED ORDER — GENTAMICIN SULFATE 40 MG/ML IJ SOLN
5.0000 mg/kg | INTRAVENOUS | Status: AC
  Administered 2017-05-17: 500 mg via INTRAVENOUS
  Filled 2017-05-17: qty 12.5

## 2017-05-17 MED ORDER — PHENYLEPHRINE HCL 10 MG/ML IJ SOLN
INTRAMUSCULAR | Status: DC | PRN
  Administered 2017-05-17 (×3): 80 ug via INTRAVENOUS

## 2017-05-17 MED ORDER — ONDANSETRON HCL 4 MG/2ML IJ SOLN
4.0000 mg | Freq: Four times a day (QID) | INTRAMUSCULAR | Status: DC | PRN
  Filled 2017-05-17: qty 2

## 2017-05-17 MED ORDER — MORPHINE SULFATE 2 MG/ML IV SOLN
INTRAVENOUS | Status: DC
  Administered 2017-05-17: 16:00:00 via INTRAVENOUS
  Administered 2017-05-17: 24 mg via INTRAVENOUS
  Administered 2017-05-18: 21:00:00 via INTRAVENOUS
  Administered 2017-05-18: 22.5 mg via INTRAVENOUS
  Administered 2017-05-18: 05:00:00 via INTRAVENOUS
  Administered 2017-05-18: 12 mg via INTRAVENOUS
  Administered 2017-05-18: 4.5 mg via INTRAVENOUS
  Administered 2017-05-18: 6 mg via INTRAVENOUS
  Administered 2017-05-18: 10.5 mg via INTRAVENOUS
  Administered 2017-05-19: 6 mg via INTRAVENOUS
  Administered 2017-05-19: 7.5 mg via INTRAVENOUS
  Administered 2017-05-19: 6 mg via INTRAVENOUS
  Administered 2017-05-19: 4.5 mg via INTRAVENOUS
  Administered 2017-05-19: 6 mg via INTRAVENOUS
  Administered 2017-05-19: 7.5 mg via INTRAVENOUS
  Administered 2017-05-20: 3 mg via INTRAVENOUS
  Administered 2017-05-20: 0 mg via INTRAVENOUS
  Filled 2017-05-17 (×3): qty 30

## 2017-05-17 MED ORDER — CLINDAMYCIN PHOSPHATE 900 MG/50ML IV SOLN
INTRAVENOUS | Status: AC
  Filled 2017-05-17: qty 50

## 2017-05-17 MED ORDER — BUPIVACAINE-EPINEPHRINE (PF) 0.25% -1:200000 IJ SOLN
INTRAMUSCULAR | Status: AC
  Filled 2017-05-17: qty 30

## 2017-05-17 MED ORDER — SODIUM CHLORIDE 0.9% FLUSH: 9.0000 mL | INTRAVENOUS | Status: DC | PRN

## 2017-05-17 MED ORDER — 0.9 % SODIUM CHLORIDE (POUR BTL) OPTIME
TOPICAL | Status: DC | PRN
  Administered 2017-05-17 (×2): 1000 mL

## 2017-05-17 MED ORDER — MEPERIDINE HCL 25 MG/ML IJ SOLN: 6.2500 mg | INTRAMUSCULAR | Status: DC | PRN

## 2017-05-17 MED ORDER — ALBUMIN HUMAN 5 % IV SOLN
INTRAVENOUS | Status: DC | PRN
  Administered 2017-05-17: 14:00:00 via INTRAVENOUS

## 2017-05-17 MED ORDER — EPHEDRINE SULFATE 50 MG/ML IJ SOLN
INTRAMUSCULAR | Status: DC | PRN
  Administered 2017-05-17: 15 mg via INTRAVENOUS
  Administered 2017-05-17 (×2): 10 mg via INTRAVENOUS
  Administered 2017-05-17: 5 mg via INTRAVENOUS

## 2017-05-17 MED ORDER — GENTAMICIN SULFATE 40 MG/ML IJ SOLN: Freq: Once | INTRAVENOUS | Status: DC

## 2017-05-17 MED ORDER — MIDAZOLAM HCL 2 MG/2ML IJ SOLN
INTRAMUSCULAR | Status: AC
  Filled 2017-05-17: qty 2

## 2017-05-17 MED ORDER — LACTATED RINGERS IV SOLN
INTRAVENOUS | Status: DC | PRN
  Administered 2017-05-17 (×2): via INTRAVENOUS

## 2017-05-17 MED ORDER — FENTANYL CITRATE (PF) 100 MCG/2ML IJ SOLN
25.0000 ug | INTRAMUSCULAR | Status: DC | PRN
  Administered 2017-05-17 (×2): 50 ug via INTRAVENOUS

## 2017-05-17 MED ORDER — POTASSIUM CHLORIDE IN NACL 20-0.9 MEQ/L-% IV SOLN
INTRAVENOUS | Status: DC
  Administered 2017-05-18 (×3): via INTRAVENOUS
  Filled 2017-05-17 (×7): qty 1000

## 2017-05-17 MED ORDER — SUGAMMADEX SODIUM 200 MG/2ML IV SOLN
INTRAVENOUS | Status: DC | PRN
  Administered 2017-05-17: 300 mg via INTRAVENOUS

## 2017-05-17 MED ORDER — MIDAZOLAM HCL 5 MG/5ML IJ SOLN
INTRAMUSCULAR | Status: DC | PRN
  Administered 2017-05-17: 2 mg via INTRAVENOUS

## 2017-05-17 MED ORDER — DIPHENHYDRAMINE HCL 12.5 MG/5ML PO ELIX
12.5000 mg | ORAL_SOLUTION | Freq: Four times a day (QID) | ORAL | Status: DC | PRN
  Filled 2017-05-17: qty 5

## 2017-05-17 MED ORDER — NALOXONE HCL 0.4 MG/ML IJ SOLN
0.4000 mg | INTRAMUSCULAR | Status: DC | PRN
  Filled 2017-05-17: qty 1

## 2017-05-17 MED ORDER — LACTATED RINGERS IV SOLN: INTRAVENOUS | Status: DC

## 2017-05-17 MED ORDER — DIPHENHYDRAMINE HCL 50 MG/ML IJ SOLN: 12.5000 mg | Freq: Four times a day (QID) | INTRAMUSCULAR | Status: DC | PRN

## 2017-05-17 MED ORDER — ROCURONIUM BROMIDE 100 MG/10ML IV SOLN
INTRAVENOUS | Status: DC | PRN
  Administered 2017-05-17: 10 mg via INTRAVENOUS
  Administered 2017-05-17: 50 mg via INTRAVENOUS

## 2017-05-17 MED ORDER — FENTANYL CITRATE (PF) 100 MCG/2ML IJ SOLN
INTRAMUSCULAR | Status: DC | PRN
  Administered 2017-05-17: 50 ug via INTRAVENOUS
  Administered 2017-05-17 (×2): 100 ug via INTRAVENOUS

## 2017-05-17 MED ORDER — LIDOCAINE HCL (CARDIAC) 20 MG/ML IV SOLN
INTRAVENOUS | Status: DC | PRN
  Administered 2017-05-17: 100 mg via INTRAVENOUS

## 2017-05-17 MED ORDER — PROPOFOL 10 MG/ML IV BOLUS
INTRAVENOUS | Status: DC | PRN
  Administered 2017-05-17: 200 mg via INTRAVENOUS

## 2017-05-17 MED ORDER — FENTANYL CITRATE (PF) 100 MCG/2ML IJ SOLN
INTRAMUSCULAR | Status: AC
  Filled 2017-05-17: qty 2

## 2017-05-17 MED ORDER — PROPOFOL 10 MG/ML IV BOLUS
INTRAVENOUS | Status: AC
  Filled 2017-05-17: qty 20

## 2017-05-17 MED ORDER — BUPIVACAINE-EPINEPHRINE 0.25% -1:200000 IJ SOLN
INTRAMUSCULAR | Status: DC | PRN
  Administered 2017-05-17: 20 mL

## 2017-05-17 MED ORDER — LACTATED RINGERS IV SOLN
INTRAVENOUS | Status: DC
  Administered 2017-05-17: 50 mL/h via INTRAVENOUS

## 2017-05-17 SURGICAL SUPPLY — 69 items
APPLIER CLIP 5 13 M/L LIGAMAX5 (MISCELLANEOUS)
APPLIER CLIP ROT 10 11.4 M/L (STAPLE)
BLADE CLIPPER SURG (BLADE) IMPLANT
CANISTER SUCT 3000ML PPV (MISCELLANEOUS) ×2 IMPLANT
CELLS DAT CNTRL 66122 CELL SVR (MISCELLANEOUS) ×1 IMPLANT
CHLORAPREP W/TINT 26ML (MISCELLANEOUS) ×2 IMPLANT
CLIP APPLIE 5 13 M/L LIGAMAX5 (MISCELLANEOUS) IMPLANT
CLIP APPLIE ROT 10 11.4 M/L (STAPLE) IMPLANT
COVER MAYO STAND STRL (DRAPES) ×4 IMPLANT
COVER SURGICAL LIGHT HANDLE (MISCELLANEOUS) ×4 IMPLANT
DERMABOND ADVANCED (GAUZE/BANDAGES/DRESSINGS) ×1
DERMABOND ADVANCED .7 DNX12 (GAUZE/BANDAGES/DRESSINGS) ×1 IMPLANT
DRAPE HALF SHEET 40X57 (DRAPES) ×2 IMPLANT
DRAPE UTILITY XL STRL (DRAPES) ×10 IMPLANT
DRAPE WARM FLUID 44X44 (DRAPE) ×2 IMPLANT
DRSG OPSITE POSTOP 4X10 (GAUZE/BANDAGES/DRESSINGS) IMPLANT
DRSG OPSITE POSTOP 4X6 (GAUZE/BANDAGES/DRESSINGS) ×2 IMPLANT
DRSG OPSITE POSTOP 4X8 (GAUZE/BANDAGES/DRESSINGS) IMPLANT
ELECT BLADE 6.5 EXT (BLADE) ×2 IMPLANT
ELECT CAUTERY BLADE 6.4 (BLADE) ×4 IMPLANT
ELECT REM PT RETURN 9FT ADLT (ELECTROSURGICAL) ×2
ELECTRODE REM PT RTRN 9FT ADLT (ELECTROSURGICAL) ×1 IMPLANT
GEL ULTRASOUND 20GR AQUASONIC (MISCELLANEOUS) IMPLANT
GLOVE SURG SIGNA 7.5 PF LTX (GLOVE) ×4 IMPLANT
GOWN STRL REUS W/ TWL LRG LVL3 (GOWN DISPOSABLE) ×6 IMPLANT
GOWN STRL REUS W/ TWL XL LVL3 (GOWN DISPOSABLE) ×2 IMPLANT
GOWN STRL REUS W/TWL LRG LVL3 (GOWN DISPOSABLE) ×6
GOWN STRL REUS W/TWL XL LVL3 (GOWN DISPOSABLE) ×2
KIT BASIN OR (CUSTOM PROCEDURE TRAY) ×2 IMPLANT
LEGGING LITHOTOMY PAIR STRL (DRAPES) ×2 IMPLANT
LIGASURE IMPACT 36 18CM CVD LR (INSTRUMENTS) ×2 IMPLANT
NS IRRIG 1000ML POUR BTL (IV SOLUTION) ×4 IMPLANT
PAD ARMBOARD 7.5X6 YLW CONV (MISCELLANEOUS) ×4 IMPLANT
PENCIL BUTTON HOLSTER BLD 10FT (ELECTRODE) ×4 IMPLANT
RTRCTR WOUND ALEXIS 18CM MED (MISCELLANEOUS) ×2
SCISSORS LAP 5X35 DISP (ENDOMECHANICALS) ×2 IMPLANT
SET IRRIG TUBING LAPAROSCOPIC (IRRIGATION / IRRIGATOR) IMPLANT
SHEARS HARMONIC ACE PLUS 36CM (ENDOMECHANICALS) ×2 IMPLANT
SLEEVE ENDOPATH XCEL 5M (ENDOMECHANICALS) ×2 IMPLANT
SPECIMEN JAR LARGE (MISCELLANEOUS) ×2 IMPLANT
STAPLER CIRC CVD 29MM 37CM (STAPLE) ×2 IMPLANT
STAPLER CUT CVD 40MM GREEN (STAPLE) ×2 IMPLANT
STAPLER CUT RELOAD GREEN (STAPLE) ×2 IMPLANT
STAPLER VISISTAT 35W (STAPLE) ×2 IMPLANT
SUCTION POOLE TIP (SUCTIONS) ×2 IMPLANT
SURGILUBE 2OZ TUBE FLIPTOP (MISCELLANEOUS) ×2 IMPLANT
SUT MNCRL AB 4-0 PS2 18 (SUTURE) ×4 IMPLANT
SUT PDS AB 1 TP1 96 (SUTURE) ×4 IMPLANT
SUT PROLENE 2 0 CT2 30 (SUTURE) ×2 IMPLANT
SUT PROLENE 2 0 KS (SUTURE) IMPLANT
SUT SILK 2 0 SH CR/8 (SUTURE) ×2 IMPLANT
SUT SILK 2 0 TIES 10X30 (SUTURE) ×2 IMPLANT
SUT SILK 3 0 SH CR/8 (SUTURE) ×2 IMPLANT
SUT SILK 3 0 TIES 10X30 (SUTURE) ×2 IMPLANT
SYR BULB IRRIGATION 50ML (SYRINGE) ×2 IMPLANT
SYS LAPSCP GELPORT 120MM (MISCELLANEOUS)
SYSTEM LAPSCP GELPORT 120MM (MISCELLANEOUS) IMPLANT
TOWEL OR 17X26 10 PK STRL BLUE (TOWEL DISPOSABLE) ×4 IMPLANT
TRAY FOLEY W/METER SILVER 16FR (SET/KITS/TRAYS/PACK) ×2 IMPLANT
TRAY LAPAROSCOPIC MC (CUSTOM PROCEDURE TRAY) ×2 IMPLANT
TRAY PROCTOSCOPIC FIBER OPTIC (SET/KITS/TRAYS/PACK) ×2 IMPLANT
TROCAR XCEL 12X100 BLDLESS (ENDOMECHANICALS) IMPLANT
TROCAR XCEL BLUNT TIP 100MML (ENDOMECHANICALS) ×2 IMPLANT
TROCAR XCEL NON-BLD 11X100MML (ENDOMECHANICALS) IMPLANT
TROCAR XCEL NON-BLD 5MMX100MML (ENDOMECHANICALS) ×2 IMPLANT
TUBE CONNECTING 12X1/4 (SUCTIONS) ×4 IMPLANT
TUBING INSUF HEATED (TUBING) ×2 IMPLANT
TUBING INSUFFLATION (TUBING) ×2 IMPLANT
YANKAUER SUCT BULB TIP NO VENT (SUCTIONS) ×6 IMPLANT

## 2017-05-17 NOTE — Interval H&P Note (Signed)
History and Physical Interval Note:no change in H and P  05/17/2017 12:39 PM  Dakota Santos  has presented today for surgery, with the diagnosis of Recurrent sigmoid diverticulitis  The various methods of treatment have been discussed with the patient and family. After consideration of risks, benefits and other options for treatment, the patient has consented to  Procedure(s): LAPAROSCOPIC ASSISTED SIGMOID COLECTOMY (N/A) as a surgical intervention .  The patient's history has been reviewed, patient examined, no change in status, stable for surgery.  I have reviewed the patient's chart and labs.  Questions were answered to the patient's satisfaction.     BLACKMAN,DOUGLAS A

## 2017-05-17 NOTE — Op Note (Signed)
LAPAROSCOPIC ASSISTED SIGMOID COLECTOMY  Procedure Note  Dakota Santos 05/17/2017   Pre-op Diagnosis: Recurrent sigmoid diverticulitis     Post-op Diagnosis: same  Procedure(s): LAPAROSCOPIC ASSISTED SIGMOID COLECTOMY  Surgeon(s): Kinsinger, Arta Bruce, MD Coralie Keens, MD  Anesthesia: General  Staff:  Circulator: Nicholos Johns, RN Relief Circulator: Susann Givens, RN Relief Scrub: Thereasa Distance T Scrub Person: Barrett, Benjaman Lobe Circulator Assistant: Rosanne Sack, RN Float Surgical Tech: Blaine Hamper T  Estimated Blood Loss: less than 50 mL               Specimens: sent to path  Indications: This is a 56 year old gentleman who is had multiple bouts of sigmoid diverticulitis over the last 5 years. He has had 3 attacks this year. He has had a colonoscopy which is otherwise unremarkable. He remains symptomatic so the decision has been made to proceed with partial colectomy.  Findings: The patient had a focal area of the sigmoid colon which was firm and hard consistent with the multiple attacks of diverticulitis. There was no other intra-abdominal pathology identified  Procedure: The patient was brought to the operating room and identified as the correct patient. He is placed supine on the operating table and general seizures induced.  The patient was then placed in the lithotomy position. His abdomen and perineum were then prepped and draped in the usual sterile fashion. I made a small vertical incision above the umbilicus. I carried the stab to the fascia which is then opened with a scalpel. A hemostat was then used to easily passed into the peritoneal cavity. A 0 Vicryl pursing suture was placed around the fascia opening. The [facility] port was placed in the opening insufflation of the abdomen was begun. I then placed 25 mm ports the patient's right lower quadrant under direct vision. Identified the sigmoid colon. The patient had  an easily visual phlegmon in his previous attacks. The proximal colon and distal colon and rectum appeared normal. I took down several attachments of the sigmoid colon to the pelvic sidewall, right scalpel. I then mobilized the descending colon working toward the splenic flexure along the white line of toe with the harmonic scalpel. I took down several more lesions of the colon to the left pelvic sidewall.: Then became much more mobile. Once the colon was completely mobilized as a converted to an open part of the procedure. I created a small midline incision in the patient's lower midline abdomen with a scalpel. I then took this down through all layers of the abdominal wall with the cautery and opened the peritoneum the entire length of the incision. I then placed a wound protector through the incision. I was able to easily eviscerate the sigmoid colon. I then transected the colon proximal and distal to the area of diverticulitis with the contour stapling device. The mesentery was then taken down with the LigaSure. I then opened up the proximal end of: Along the staple line with the cautery. I then placed a 2-0 Prolene pursing suture around the opening. I brought a 29 EEA stapler onto the field. I placed ample through the opening in the colon and then tied the 2-0 Prolene suture in place closing it around the ample. I then cleared off around the ample fat that was over the top of the colon with the cautery. Next I brought dilators through the anus and up toward the rectal stump. These easily passed up to the staple line. I  then brought the 29 EEA staplers proximal and through the anus and up to the anterior portion of the rectum at the staple line. I then opened up the stapling device bringing out the point to the anterior rectal wall under direct vision. I then attached to into the stapling device together and slowly closes stapling device bringing the colon down to the rectum. The stapling device was then fired  creating the circular end-to-end anastomosis. I checked both doughnuts and they appear to be intact. We then filled the pelvis. And clamped the bowel proximal to the staple line. I inserted a proctoscope into the anus and insufflated dilating the anastomosis. No evidence of leak was identified. At this point I relieved the distention. I then reinforced the staple line with interrupted 3-0 silk sutures. The anastomosis appeared pink and well-perfused. We then irrigated the abdomen further with saline. Hemostasis appeared to be achieved. We then removed all ports and the wound protector. We changed gown and gloves and sterile drapes. I then closed the patient's midline fascia with a running #1 looped PDS suture. I irrigated all incisions with saline. I then anesthetized with Marcaine and closed all incisions with 4-0 Monocryl sutures. Dermabond was then applied. The patient tolerated the procedure well. All the counts were correct at the end of the procedure. The patient was in a stated in the operating room and taken in a stable condition to the recovery room.          Dakota Santos A   Date: 05/17/2017  Time: 3:03 PM

## 2017-05-17 NOTE — Anesthesia Procedure Notes (Signed)
Procedure Name: Intubation Date/Time: 05/17/2017 12:55 PM Performed by: Neldon Newport Pre-anesthesia Checklist: Timeout performed, Patient being monitored, Suction available, Emergency Drugs available and Patient identified Patient Re-evaluated:Patient Re-evaluated prior to inductionOxygen Delivery Method: Circle system utilized Preoxygenation: Pre-oxygenation with 100% oxygen Intubation Type: IV induction Ventilation: Mask ventilation without difficulty and Oral airway inserted - appropriate to patient size Laryngoscope Size: Mac and 4 Grade View: Grade II Tube type: Oral Tube size: 7.5 mm Number of attempts: 1 Placement Confirmation: breath sounds checked- equal and bilateral,  positive ETCO2 and ETT inserted through vocal cords under direct vision Secured at: 21 cm Tube secured with: Tape Dental Injury: Teeth and Oropharynx as per pre-operative assessment

## 2017-05-17 NOTE — Transfer of Care (Signed)
Immediate Anesthesia Transfer of Care Note  Patient: Dakota Santos  Procedure(s) Performed: Procedure(s): LAPAROSCOPIC ASSISTED SIGMOID COLECTOMY (N/A)  Patient Location: PACU  Anesthesia Type:General  Level of Consciousness: awake, alert  and oriented  Airway & Oxygen Therapy: Patient Spontanous Breathing and Patient connected to face mask oxygen  Post-op Assessment: Report given to RN, Post -op Vital signs reviewed and stable and Patient moving all extremities X 4  Post vital signs: Reviewed and stable  Last Vitals:  Vitals:   05/17/17 1115 05/17/17 1509  BP: (!) 131/91 (!) 147/82  Pulse: 67 67  Resp: 20 18  Temp: 36.6 C 36.4 C    Last Pain:  Vitals:   05/17/17 1115  TempSrc: Oral  PainSc: 1       Patients Stated Pain Goal: 2 (13/08/65 7846)  Complications: No apparent anesthesia complications

## 2017-05-17 NOTE — Anesthesia Preprocedure Evaluation (Addendum)
Anesthesia Evaluation  Patient identified by MRN, date of birth, ID band Patient awake    Reviewed: Allergy & Precautions, NPO status , Patient's Chart, lab work & pertinent test results  Airway Mallampati: II  TM Distance: >3 FB Neck ROM: Full    Dental no notable dental hx. (+) Teeth Intact, Dental Advidsory Given   Pulmonary former smoker,    Pulmonary exam normal breath sounds clear to auscultation       Cardiovascular hypertension, Pt. on medications Normal cardiovascular exam Rhythm:Regular Rate:Normal     Neuro/Psych negative neurological ROS  negative psych ROS   GI/Hepatic negative GI ROS, Neg liver ROS,   Endo/Other  negative endocrine ROS  Renal/GU negative Renal ROS  negative genitourinary   Musculoskeletal negative musculoskeletal ROS (+)   Abdominal   Peds negative pediatric ROS (+)  Hematology negative hematology ROS (+)   Anesthesia Other Findings   Reproductive/Obstetrics negative OB ROS                            Anesthesia Physical Anesthesia Plan  ASA: II  Anesthesia Plan: General   Post-op Pain Management:    Induction: Intravenous  PONV Risk Score and Plan: 2 and Ondansetron and Dexamethasone  Airway Management Planned: Oral ETT  Additional Equipment:   Intra-op Plan:   Post-operative Plan: Extubation in OR  Informed Consent: I have reviewed the patients History and Physical, chart, labs and discussed the procedure including the risks, benefits and alternatives for the proposed anesthesia with the patient or authorized representative who has indicated his/her understanding and acceptance.   Dental Advisory Given  Plan Discussed with: Anesthesiologist, CRNA and Surgeon  Anesthesia Plan Comments:        Anesthesia Quick Evaluation

## 2017-05-18 LAB — BASIC METABOLIC PANEL
Anion gap: 9 (ref 5–15)
BUN: 7 mg/dL (ref 6–20)
CO2: 22 mmol/L (ref 22–32)
Calcium: 8.3 mg/dL — ABNORMAL LOW (ref 8.9–10.3)
Chloride: 104 mmol/L (ref 101–111)
Creatinine, Ser: 1 mg/dL (ref 0.61–1.24)
GFR calc Af Amer: >60 mL/min (ref >60)
GFR calc non Af Amer: >60 mL/min (ref >60)
Glucose, Bld: 109 mg/dL — ABNORMAL HIGH (ref 65–99)
Potassium: 3.8 mmol/L (ref 3.5–5.1)
Sodium: 135 mmol/L (ref 135–145)

## 2017-05-18 LAB — CBC
HCT: 42.3 % (ref 39.0–52.0)
Hemoglobin: 14.3 g/dL (ref 13.0–17.0)
MCH: 29.9 pg (ref 26.0–34.0)
MCHC: 33.8 g/dL (ref 30.0–36.0)
MCV: 88.5 fL (ref 78.0–100.0)
Platelets: 319 10*3/uL (ref 150–400)
RBC: 4.78 MIL/uL (ref 4.22–5.81)
RDW: 13.6 % (ref 11.5–15.5)
WBC: 12.8 10*3/uL — ABNORMAL HIGH (ref 4.0–10.5)

## 2017-05-18 MED ORDER — LISINOPRIL-HYDROCHLOROTHIAZIDE 20-25 MG PO TABS: 0.5000 | ORAL_TABLET | Freq: Every day | ORAL | Status: DC

## 2017-05-18 MED ORDER — ENOXAPARIN SODIUM 40 MG/0.4ML ~~LOC~~ SOLN
40.0000 mg | SUBCUTANEOUS | Status: DC
  Administered 2017-05-18 – 2017-05-20 (×3): 40 mg via SUBCUTANEOUS
  Filled 2017-05-18 (×3): qty 0.4

## 2017-05-18 MED ORDER — ONDANSETRON HCL 4 MG PO TABS: 4.0000 mg | ORAL_TABLET | Freq: Four times a day (QID) | ORAL | Status: DC | PRN

## 2017-05-18 MED ORDER — ALUM & MAG HYDROXIDE-SIMETH 200-200-20 MG/5ML PO SUSP: 30.0000 mL | Freq: Four times a day (QID) | ORAL | Status: DC | PRN

## 2017-05-18 MED ORDER — LISINOPRIL 10 MG PO TABS
10.0000 mg | ORAL_TABLET | Freq: Every day | ORAL | Status: DC
  Administered 2017-05-18 – 2017-05-21 (×4): 10 mg via ORAL
  Filled 2017-05-18 (×4): qty 1

## 2017-05-18 MED ORDER — ONDANSETRON HCL 4 MG/2ML IJ SOLN: 4.0000 mg | Freq: Four times a day (QID) | INTRAMUSCULAR | Status: DC | PRN

## 2017-05-18 MED ORDER — MORPHINE SULFATE (PF) 4 MG/ML IV SOLN
4.0000 mg | Freq: Once | INTRAVENOUS | Status: DC
  Filled 2017-05-18: qty 1

## 2017-05-18 MED ORDER — HYDROCHLOROTHIAZIDE 12.5 MG PO CAPS
12.5000 mg | ORAL_CAPSULE | Freq: Every day | ORAL | Status: DC
  Administered 2017-05-18 – 2017-05-21 (×4): 12.5 mg via ORAL
  Filled 2017-05-18 (×4): qty 1

## 2017-05-18 MED ORDER — NICOTINE 21 MG/24HR TD PT24
21.0000 mg | MEDICATED_PATCH | Freq: Every day | TRANSDERMAL | Status: DC
  Administered 2017-05-18 – 2017-05-21 (×4): 21 mg via TRANSDERMAL
  Filled 2017-05-18 (×4): qty 1

## 2017-05-18 MED ORDER — DIPHENHYDRAMINE HCL 50 MG/ML IJ SOLN
25.0000 mg | Freq: Four times a day (QID) | INTRAMUSCULAR | Status: DC | PRN
  Filled 2017-05-18: qty 1

## 2017-05-18 MED ORDER — DIPHENHYDRAMINE HCL 25 MG PO CAPS: 25.0000 mg | ORAL_CAPSULE | Freq: Four times a day (QID) | ORAL | Status: DC | PRN

## 2017-05-18 MED ORDER — ALVIMOPAN 12 MG PO CAPS
12.0000 mg | ORAL_CAPSULE | Freq: Two times a day (BID) | ORAL | Status: DC
  Administered 2017-05-18 (×2): 12 mg via ORAL
  Filled 2017-05-18 (×2): qty 1

## 2017-05-18 NOTE — Progress Notes (Signed)
1 Day Post-Op   Subjective/Chief Complaint: Comfortable No nausea this morning   Objective: Vital signs in last 24 hours: Temp:  [97.6 F (36.4 C)-98.5 F (36.9 C)] 98 F (36.7 C) (07/07 0549) Pulse Rate:  [64-80] 74 (07/07 0549) Resp:  [11-20] 16 (07/07 0549) BP: (120-147)/(71-91) 133/71 (07/07 0549) SpO2:  [90 %-99 %] 96 % (07/07 0549) Weight:  [100.4 kg (221 lb 6.4 oz)] 100.4 kg (221 lb 6.4 oz) (07/06 1115) Last BM Date: 05/16/17  Intake/Output from previous day: 07/06 0701 - 07/07 0700 In: 2062.5 [I.V.:1750; IV Piggyback:312.5] Out: 740 [Urine:725; Blood:15] Intake/Output this shift: Total I/O In: 450 [I.V.:450] Out: 625 [Urine:625]  Exam: Looks comfortable Abdomen soft  Lab Results:  No results for input(s): WBC, HGB, HCT, PLT in the last 72 hours. BMET No results for input(s): NA, K, CL, CO2, GLUCOSE, BUN, CREATININE, CALCIUM in the last 72 hours. PT/INR No results for input(s): LABPROT, INR in the last 72 hours. ABG No results for input(s): PHART, HCO3 in the last 72 hours.  Invalid input(s): PCO2, PO2  Studies/Results: No results found.  Anti-infectives: Anti-infectives    Start     Dose/Rate Route Frequency Ordered Stop   05/17/17 1245  gentamicin (GARAMYCIN) 500 mg in dextrose 5 % 50 mL IVPB     5 mg/kg  100.4 kg 125 mL/hr over 30 Minutes Intravenous To Surgery 05/17/17 1239 05/17/17 1321   05/17/17 1215  gentamicin (GARAMYCIN) 500 mg, clindamycin (CLEOCIN) 900 mg in dextrose 5 % 100 mL IVPB  Status:  Discontinued     237 mL/hr over 30 Minutes Intravenous  Once 05/17/17 1203 05/17/17 1239   05/17/17 1213  gentamicin (GARAMYCIN) 1.6-0.9 MG/ML-% IVPB  Status:  Discontinued    Comments:  Mikeal Hawthorne, John   : cabinet override      05/17/17 1213 05/17/17 1241   05/17/17 1127  clindamycin (CLEOCIN) 900 MG/50ML IVPB    Comments:  Vira Agar, Beth   : cabinet override      05/17/17 1127 05/17/17 2329      Assessment/Plan: s/p  Procedure(s): LAPAROSCOPIC ASSISTED SIGMOID COLECTOMY (N/A)  D/c foley Start clear liquid diet ambulate  LOS: 1 day    BLACKMAN,DOUGLAS A 05/18/2017

## 2017-05-18 NOTE — Progress Notes (Signed)
Pharmacy notified for Morphine PCA refill.

## 2017-05-18 NOTE — Progress Notes (Signed)
Pharmacy notification (3rd) for PCA refill.

## 2017-05-18 NOTE — Progress Notes (Signed)
F/U to pharmcacy for Morphine PCA refill.

## 2017-05-18 NOTE — Progress Notes (Signed)
Voided large amount at this time after foley was dc'd  but unable to measure because he has to sit in the commode.

## 2017-05-18 NOTE — Progress Notes (Signed)
78ml Morphine PCA waste in sharps w/Carla, RN.

## 2017-05-19 MED ORDER — KETOROLAC TROMETHAMINE 30 MG/ML IJ SOLN
30.0000 mg | Freq: Four times a day (QID) | INTRAMUSCULAR | Status: AC
Start: 2017-05-19 — End: 2017-05-20
  Administered 2017-05-19 – 2017-05-20 (×4): 30 mg via INTRAVENOUS
  Filled 2017-05-19 (×4): qty 1

## 2017-05-19 MED ORDER — METHOCARBAMOL 1000 MG/10ML IJ SOLN
500.0000 mg | Freq: Four times a day (QID) | INTRAVENOUS | Status: DC
  Administered 2017-05-19 – 2017-05-21 (×8): 500 mg via INTRAVENOUS
  Filled 2017-05-19 (×11): qty 5

## 2017-05-19 NOTE — Progress Notes (Signed)
Assessment Principal Problem:   Diverticulitis of large intestine s/p sigmoid colectomy 05/17/17-starting to have some bowel function Active Problems:   Essential hypertension-BP normal this AM    Plan:  Advance diet. Decrease IVF.   LOS: 2 days     2 Days Post-Op  Chief Complaint/Subjective: Very sore when moving.  Tolerating clear liquids.  Had small BM after a cup of coffee this AM.  Objective: Vital signs in last 24 hours: Temp:  [98.1 F (36.7 C)-99.1 F (37.3 C)] 98.9 F (37.2 C) (07/08 0439) Pulse Rate:  [71-83] 83 (07/08 0439) Resp:  [13-22] 22 (07/08 0839) BP: (117-140)/(66-88) 118/77 (07/08 0439) SpO2:  [92 %-98 %] 96 % (07/08 0839) Last BM Date: 05/19/17 (Patient reported )  Intake/Output from previous day: 07/07 0701 - 07/08 0700 In: 1520 [P.O.:420; I.V.:1100] Out: 2525 [Urine:2525] Intake/Output this shift: No intake/output data recorded.  PE: General- In NAD.  Awake and alert. Abdomen-soft, wounds clean, hypoactive bowel sounds  Lab Results:   Recent Labs  05/18/17 1236  WBC 12.8*  HGB 14.3  HCT 42.3  PLT 319   BMET  Recent Labs  05/18/17 1236  NA 135  K 3.8  CL 104  CO2 22  GLUCOSE 109*  BUN 7  CREATININE 1.00  CALCIUM 8.3*   PT/INR No results for input(s): LABPROT, INR in the last 72 hours. Comprehensive Metabolic Panel:    Component Value Date/Time   NA 135 05/18/2017 1236   NA 137 05/08/2017 1556   K 3.8 05/18/2017 1236   K 3.6 05/08/2017 1556   CL 104 05/18/2017 1236   CL 105 05/08/2017 1556   CO2 22 05/18/2017 1236   CO2 24 05/08/2017 1556   BUN 7 05/18/2017 1236   BUN 11 05/08/2017 1556   CREATININE 1.00 05/18/2017 1236   CREATININE 1.09 05/08/2017 1556   GLUCOSE 109 (H) 05/18/2017 1236   GLUCOSE 96 05/08/2017 1556   CALCIUM 8.3 (L) 05/18/2017 1236   CALCIUM 9.9 05/08/2017 1556   AST 39 02/28/2017 1604   AST 26 12/25/2016 1123   ALT 42 02/28/2017 1604   ALT 47 12/25/2016 1123   ALKPHOS 63 02/28/2017 1604   ALKPHOS 62 12/25/2016 1123   BILITOT 1.2 02/28/2017 1604   BILITOT 0.8 12/25/2016 1123   PROT 7.4 02/28/2017 1604   PROT 7.5 12/25/2016 1123   ALBUMIN 4.2 02/28/2017 1604   ALBUMIN 4.5 12/25/2016 1123     Studies/Results: No results found.  Anti-infectives: Anti-infectives    Start     Dose/Rate Route Frequency Ordered Stop   05/17/17 1245  gentamicin (GARAMYCIN) 500 mg in dextrose 5 % 50 mL IVPB     5 mg/kg  100.4 kg 125 mL/hr over 30 Minutes Intravenous To Surgery 05/17/17 1239 05/17/17 1321   05/17/17 1215  gentamicin (GARAMYCIN) 500 mg, clindamycin (CLEOCIN) 900 mg in dextrose 5 % 100 mL IVPB  Status:  Discontinued     237 mL/hr over 30 Minutes Intravenous  Once 05/17/17 1203 05/17/17 1239   05/17/17 1213  gentamicin (GARAMYCIN) 1.6-0.9 MG/ML-% IVPB  Status:  Discontinued    Comments:  Mikeal Hawthorne, John   : cabinet override      05/17/17 1213 05/17/17 1241   05/17/17 1127  clindamycin (CLEOCIN) 900 MG/50ML IVPB    Comments:  Vira Agar, Beth   : cabinet override      05/17/17 1127 05/17/17 2329       ROSENBOWER,TODD J 05/19/2017

## 2017-05-20 ENCOUNTER — Encounter (HOSPITAL_COMMUNITY): Payer: Self-pay | Admitting: Surgery

## 2017-05-20 MED ORDER — HYDROMORPHONE HCL 1 MG/ML IJ SOLN: 1.0000 mg | INTRAMUSCULAR | Status: DC | PRN

## 2017-05-20 MED ORDER — OXYCODONE HCL 5 MG PO TABS
5.0000 mg | ORAL_TABLET | ORAL | Status: DC | PRN
  Administered 2017-05-20 – 2017-05-21 (×5): 10 mg via ORAL
  Filled 2017-05-20 (×5): qty 2

## 2017-05-20 NOTE — Progress Notes (Signed)
D/C PCA per orders, wasted 7ml of Morphine with Cassandria Anger, RN, will continue to monitor.

## 2017-05-20 NOTE — Progress Notes (Signed)
3 Days Post-Op   Subjective/Chief Complaint: Comfortable Passing flatus and had bm's   Objective: Vital signs in last 24 hours: Temp:  [98 F (36.7 C)-98.2 F (36.8 C)] (P) 98.1 F (36.7 C) (07/09 0556) Pulse Rate:  [68-71] (P) 71 (07/09 0556) Resp:  [13-22] (P) 19 (07/09 0556) BP: (111-117)/(74-76) (P) 106/60 (07/09 0556) SpO2:  [94 %-99 %] (P) 97 % (07/09 0556) Last BM Date: 05/19/17  Intake/Output from previous day: 07/08 0701 - 07/09 0700 In: 1140 [P.O.:1140] Out: 2 [Urine:2] Intake/Output this shift: No intake/output data recorded.  Exam: Looks good Abdomen soft, incisions clean, minimally tender  Lab Results:   Recent Labs  05/18/17 1236  WBC 12.8*  HGB 14.3  HCT 42.3  PLT 319   BMET  Recent Labs  05/18/17 1236  NA 135  K 3.8  CL 104  CO2 22  GLUCOSE 109*  BUN 7  CREATININE 1.00  CALCIUM 8.3*   PT/INR No results for input(s): LABPROT, INR in the last 72 hours. ABG No results for input(s): PHART, HCO3 in the last 72 hours.  Invalid input(s): PCO2, PO2  Studies/Results: No results found.  Anti-infectives: Anti-infectives    Start     Dose/Rate Route Frequency Ordered Stop   05/17/17 1245  gentamicin (GARAMYCIN) 500 mg in dextrose 5 % 50 mL IVPB     5 mg/kg  100.4 kg 125 mL/hr over 30 Minutes Intravenous To Surgery 05/17/17 1239 05/17/17 1321   05/17/17 1215  gentamicin (GARAMYCIN) 500 mg, clindamycin (CLEOCIN) 900 mg in dextrose 5 % 100 mL IVPB  Status:  Discontinued     237 mL/hr over 30 Minutes Intravenous  Once 05/17/17 1203 05/17/17 1239   05/17/17 1213  gentamicin (GARAMYCIN) 1.6-0.9 MG/ML-% IVPB  Status:  Discontinued    Comments:  Dakota Santos, Dakota Santos   : cabinet override      05/17/17 1213 05/17/17 1241   05/17/17 1127  clindamycin (CLEOCIN) 900 MG/50ML IVPB    Comments:  Dakota Santos, Dakota Santos   : cabinet override      05/17/17 1127 05/17/17 2329      Assessment/Plan: s/p Procedure(s): LAPAROSCOPIC ASSISTED SIGMOID COLECTOMY  (N/Santos)  D/c PCA Decrease IVF Advance diet  Dakota Santos,Dakota Santos 05/20/2017

## 2017-05-20 NOTE — Anesthesia Postprocedure Evaluation (Signed)
Anesthesia Post Note  Patient: Dakota Santos  Procedure(s) Performed: Procedure(s) (LRB): LAPAROSCOPIC ASSISTED SIGMOID COLECTOMY (N/A)     Patient location during evaluation: PACU Anesthesia Type: General Level of consciousness: awake and alert Pain management: pain level controlled Vital Signs Assessment: post-procedure vital signs reviewed and stable Respiratory status: spontaneous breathing, nonlabored ventilation, respiratory function stable and patient connected to nasal cannula oxygen Cardiovascular status: blood pressure returned to baseline and stable Postop Assessment: no signs of nausea or vomiting Anesthetic complications: no    Last Vitals:  Vitals:   05/20/17 0556 05/20/17 0820  BP: 106/60 124/87  Pulse: 71   Resp: 19   Temp: 36.7 C     Last Pain:  Vitals:   05/20/17 0821  TempSrc:   PainSc: 1                  Montez Hageman

## 2017-05-21 MED ORDER — OXYCODONE HCL 5 MG PO TABS: 5.0000 mg | ORAL_TABLET | ORAL | 0 refills | Status: DC | PRN

## 2017-05-21 NOTE — Discharge Summary (Signed)
Physician Discharge Summary  Patient ID: Dakota Santos MRN: [id number] DOB/AGE: Aug 08, 1961 56 y.o.  Admit date: 05/17/2017 Discharge date: 05/21/2017  Admission Diagnoses:  Discharge Diagnoses:  Principal Problem:   Diverticulitis of large intestine s/p sigmoid colectomy 05/17/17 Active Problems:   Essential hypertension   Sigmoid diverticulitis   Discharged Condition: good  Hospital Course: uneventful post op recovery s/p surgery.  Quick resumption of bowel function.  Transitioned to oral meds.  By POD#4 patient was tolerating a diet and having bm's.  Discharged home  Consults: None  Significant Diagnostic Studies:   Treatments: surgery: laparoscopic assisted sigmoid colectomy  Discharge Exam: Blood pressure (!) 127/91, pulse 68, temperature 98.2 F (36.8 C), temperature source Oral, resp. rate 16, weight 100.4 kg (221 lb 6.4 oz), SpO2 96 %. General appearance: alert, cooperative and no distress Resp: clear to auscultation bilaterally Cardio: regular rate and rhythm, S1, S2 normal, no murmur, click, rub or gallop Incision/Wound:abdomen soft, non-tender, incisions healing well  Disposition: 01-Home or Self Care  Discharge Instructions    Discharge patient    Complete by:  As directed    Discharge disposition:  01-Home or Self Care   Discharge patient date:  05/21/2017     Allergies as of 05/21/2017      Reactions   Penicillins    rash      Medication List    TAKE these medications   aspirin EC 81 MG tablet Take 81 mg by mouth daily.   ezetimibe 10 MG tablet Commonly known as:  ZETIA Take 10 mg by mouth daily.   fluticasone 50 MCG/ACT nasal spray Commonly known as:  FLONASE Place 1 spray into both nostrils daily as needed for allergies or rhinitis.   lisinopril-hydrochlorothiazide 20-25 MG tablet Commonly known as:  PRINZIDE,ZESTORETIC Take 0.5 tablets by mouth daily. What changed:  how much to take   oxyCODONE 5 MG immediate release tablet Commonly  known as:  Oxy IR/ROXICODONE Take 1-2 tablets (5-10 mg total) by mouth every 4 (four) hours as needed for moderate pain.      Follow-up Information    Coralie Keens, MD. Schedule an appointment as soon as possible for a visit in 3 week(s).   Specialty:  General Surgery Contact information: [address] [place] [address] [phone]           Signed: Harl Bowie 05/21/2017, 8:28 AM

## 2017-05-21 NOTE — Discharge Instructions (Signed)
[facility], [place] [phone]  OPEN ABDOMINAL SURGERY: POST OP INSTRUCTIONS  Always review your discharge instruction sheet given to you by the facility where your surgery was performed.  IF YOU HAVE DISABILITY OR FAMILY LEAVE FORMS, YOU MUST BRING THEM TO THE OFFICE FOR PROCESSING.  PLEASE DO NOT GIVE THEM TO YOUR DOCTOR.  1. A prescription for pain medication may be given to you upon discharge.  Take your pain medication as prescribed, if needed.  If narcotic pain medicine is not needed, then you may take acetaminophen (Tylenol) or ibuprofen (Advil) as needed. 2. Take your usually prescribed medications unless otherwise directed. 3. If you need a refill on your pain medication, please contact your pharmacy. They will contact our office to request authorization.  Prescriptions will not be filled after 5pm or on week-ends. 4. You should follow a light diet the first few days after arrival home, such as soup and crackers, pudding, etc.unless your doctor has advised otherwise. A high-fiber, low fat diet can be resumed as tolerated.   Be sure to include lots of fluids daily. Most patients will experience some swelling and bruising on the chest and neck area.  Ice packs will help.  Swelling and bruising can take several days to resolve 5. Most patients will experience some swelling and bruising in the area of the incision. Ice pack will help. Swelling and bruising can take several days to resolve..  6. It is common to experience some constipation if taking pain medication after surgery.  Increasing fluid intake and taking a stool softener will usually help or prevent this problem from occurring.  A mild laxative (Milk of Magnesia or Miralax) should be taken according to package directions if there are no bowel movements after 48 hours. 7.  You may have steri-strips (small skin tapes) in place directly over the incision.  These strips should be left on the skin for 7-10 days.  If your  surgeon used skin glue on the incision, you may shower in 24 hours.  The glue will flake off over the next 2-3 weeks.  Any sutures or staples will be removed at the office during your follow-up visit. You may find that a light gauze bandage over your incision may keep your staples from being rubbed or pulled. You may shower and replace the bandage daily. 8. ACTIVITIES:  You may resume regular (light) daily activities beginning the next day--such as daily self-care, walking, climbing stairs--gradually increasing activities as tolerated.  You may have sexual intercourse when it is comfortable.  Refrain from any heavy lifting or straining until approved by your doctor. a. You may drive when you no longer are taking prescription pain medication, you can comfortably wear a seatbelt, and you can safely maneuver your car and apply brakes b. Return to Work: ___________________________________ 77. You should see your doctor in the office for a follow-up appointment approximately two weeks after your surgery.  Make sure that you call for this appointment within a day or two after you arrive home to insure a convenient appointment time. OTHER INSTRUCTIONS: OK TO SHOWER TYLENOL AND IBUPROFEN ALSO FOR PAIN ICE PACK AS NEEDED RESUME NORMAL DIET NO LIFTING MORE THAN 15 POUNDS FOR 6 WEEKS FROM THE DATE OF SURGERY _____________________________________________________________ _____________________________________________________________  WHEN TO CALL YOUR DOCTOR: 1. Fever over 101.0 2. Inability to urinate 3. Nausea and/or vomiting 4. Extreme swelling or bruising 5. Continued bleeding from incision. 6. Increased pain, redness, or drainage from the  incision. 7. Difficulty swallowing or breathing 8. Muscle cramping or spasms. 9. Numbness or tingling in hands or feet or around lips.  The clinic staff is available to answer your questions during regular business hours.  Please dont hesitate to call and ask to  speak to one of the nurses if you have concerns.  For further questions, please visit [email or web address]

## 2017-05-21 NOTE — Care Management Note (Signed)
Case Management Note  Patient Details  Name: Dakota Santos MRN: [id number] Date of Birth: Aug 27, 1961  Subjective/Objective:                 Patient from home w spouse, independent pta. Will DC to home today self care. No CM consult, orders, HH or medication needs identified at DC.    Action/Plan:  DC to home self care.   Expected Discharge Date:  05/21/17               Expected Discharge Plan:  Home/Self Care  In-House Referral:     Discharge planning Services  CM Consult  Post Acute Care Choice:    Choice offered to:     DME Arranged:    DME Agency:     HH Arranged:    HH Agency:     Status of Service:  Completed, signed off  If discussed at [employer] of Stay Meetings, dates discussed:    Additional Comments:  Dakota Collet, RN 05/21/2017, 10:29 AM

## 2017-05-21 NOTE — Progress Notes (Signed)
Patient ID: Dakota Santos, male   DOB: [birth year], 56 y.o.   MRN: [id number]  Doing well On oral pain meds Tolerating a diet Having bm's Abdomen soft, incisions clean  Final path confirmed diverticulitis  Plan: Discharge home

## 2017-06-13 ENCOUNTER — Other Ambulatory Visit (HOSPITAL_BASED_OUTPATIENT_CLINIC_OR_DEPARTMENT_OTHER): Payer: Self-pay | Admitting: Family Medicine

## 2017-06-13 DIAGNOSIS — R911 Solitary pulmonary nodule: Secondary | ICD-10-CM

## 2017-06-14 ENCOUNTER — Ambulatory Visit (HOSPITAL_BASED_OUTPATIENT_CLINIC_OR_DEPARTMENT_OTHER)
Admission: RE | Admit: 2017-06-14 | Discharge: 2017-06-14 | Disposition: A | Discharge status: Home / Self Care | Payer: 59 | Source: Ambulatory Visit | Attending: Family Medicine | Admitting: Family Medicine

## 2017-06-14 ENCOUNTER — Other Ambulatory Visit (HOSPITAL_COMMUNITY): Payer: Self-pay | Admitting: Family Medicine

## 2017-06-14 DIAGNOSIS — R911 Solitary pulmonary nodule: Secondary | ICD-10-CM

## 2017-06-19 ENCOUNTER — Telehealth: Payer: Self-pay | Admitting: *Deleted

## 2017-06-19 NOTE — Telephone Encounter (Signed)
Oncology Nurse Navigator Documentation  Oncology Nurse Navigator Flowsheets 06/19/2017  Navigator Location [facility]  Referral date to RadOnc/MedOnc 06/18/2017  Navigator Encounter Type Telephone/I received referral on Dakota Santos yesterday.  I called him today.  I was unable to reach patient but leave vm message with my name and phone number to call.  I also call referring office to update them on appt time and place. They will update patient and ask him to call me.    Telephone Outgoing Call  Treatment Phase Abnormal Scans  Barriers/Navigation Needs Coordination of Care  Interventions Coordination of Care  Coordination of Care Other  Acuity Level 1  Time Spent with Patient 15

## 2017-06-20 ENCOUNTER — Telehealth: Payer: Self-pay | Admitting: *Deleted

## 2017-06-20 NOTE — Telephone Encounter (Signed)
Oncology Nurse Navigator Documentation  Oncology Nurse Navigator Flowsheets 06/20/2017  Navigator Location [facility]  Navigator Encounter Type Telephone/I received a message from Dakota Santos to call. I called him back.  I updated him on his appt at [facility] on 06/27/17.  He verbalized understanding of appt time and place.   Telephone Incoming Call;Outgoing Call  Treatment Phase Abnormal Scans  Barriers/Navigation Needs Coordination of Care  Interventions Coordination of Care  Coordination of Care Appts  Acuity Level 1  Time Spent with Patient 15

## 2017-06-21 ENCOUNTER — Encounter: Payer: Self-pay | Admitting: *Deleted

## 2017-06-25 ENCOUNTER — Ambulatory Visit (HOSPITAL_COMMUNITY)
Admission: RE | Admit: 2017-06-25 | Discharge: 2017-06-25 | Disposition: A | Discharge status: Home / Self Care | Payer: 59 | Source: Ambulatory Visit | Attending: Family Medicine | Admitting: Family Medicine

## 2017-06-25 DIAGNOSIS — K118 Other diseases of salivary glands: Secondary | ICD-10-CM | POA: Diagnosis not present

## 2017-06-25 DIAGNOSIS — Z9889 Other specified postprocedural states: Secondary | ICD-10-CM | POA: Diagnosis not present

## 2017-06-25 DIAGNOSIS — R911 Solitary pulmonary nodule: Secondary | ICD-10-CM | POA: Insufficient documentation

## 2017-06-25 LAB — GLUCOSE, CAPILLARY: Glucose-Capillary: 103 mg/dL — ABNORMAL HIGH (ref 65–99)

## 2017-06-25 MED ORDER — FLUDEOXYGLUCOSE F - 18 (FDG) INJECTION
10.9000 | Freq: Once | INTRAVENOUS | Status: AC | PRN
  Administered 2017-06-25: 10.9 via INTRAVENOUS

## 2017-06-27 ENCOUNTER — Encounter: Payer: Self-pay | Admitting: *Deleted

## 2017-06-27 ENCOUNTER — Institutional Professional Consult (permissible substitution) (INDEPENDENT_AMBULATORY_CARE_PROVIDER_SITE_OTHER): Payer: 59 | Admitting: Cardiothoracic Surgery

## 2017-06-27 VITALS — BP 123/88 | HR 68 | Temp 98.4°F | Resp 20 | Wt 220.4 lb

## 2017-06-27 DIAGNOSIS — R918 Other nonspecific abnormal finding of lung field: Secondary | ICD-10-CM | POA: Diagnosis not present

## 2017-06-27 NOTE — Progress Notes (Signed)
[address]       [place] [address]             [phone]                    Saylor M Mccaul Evant Medical Record #[id number] Date of Birth: 01-Jun-1961  Referring: Kelton Pillar, MD Primary Care: Kelton Pillar, MD  Chief Complaint:   Left lung Mass  History of Present Illness:    Dakota Santos 56 y.o. male is seen in the office  today for left lung mass noted incidentially on ct of abdomen done in Feb 2018 for abdominal pain. Ultimitaly patient had repeated episodes of abdominal pain since February. In July he underwent sigmoid colon resection for repeated episodes of diverticulitis.  Patient referred for follow up of left line lesion, complete ct of chest done and PET. Patient is long term smoker more then 40 years, he quit in April 2018.   Patient has previous dx of COPD, he had been prescribed Spiriva, but could not tolerate and stop using. He has no pulmonary symptoms at rest does note sob if climbs two flights of stairs  No previous cardiac history  Current Activity/ Functional Status:  Patient is independent with mobility/ambulation, transfers, ADL's, IADL's.   Zubrod Score: At the time of surgery this patient's most appropriate activity status/level should be described as: []     0    Normal activity, no symptoms [x]     1    Restricted in physical strenuous activity but ambulatory, able to do out light work []     2    Ambulatory and capable of self care, unable to do work activities, up and about               >50 % of waking hours                              []     3    Only limited self care, in bed greater than 50% of waking hours []     4    Completely disabled, no self care, confined to bed or chair []     5    Moribund   Past Medical History:  Diagnosis Date  . Diverticulitis   . Hyperlipidemia   . Hypertension     Past Surgical History:  Procedure Laterality Date  . HERNIA REPAIR    . LAPAROSCOPIC SIGMOID COLECTOMY N/A  05/17/2017   Procedure: LAPAROSCOPIC ASSISTED SIGMOID COLECTOMY;  Surgeon: Coralie Keens, MD;  Location: [facility];  Service: General;  Laterality: N/A;  . ORIF ELBOW FRACTURE Left 09/18/2014   Procedure: OPEN REDUCTION INTERNAL FIXATION (ORIF) ELBOW/OLECRANON FRACTURE LEFT;  Surgeon: Nita Sells, MD;  Location: [facility];  Service: Orthopedics;  Laterality: Left;    Family History  Problem Relation Age of Onset  . Atrial fibrillation Mother   . Prostate cancer Father     Social History   Social History  . Marital status: Married    Spouse name: N/A  . Number of children: N/A  . Years of education: N/A   Occupational History  . chief Chief Financial Officer    Social History Main Topics  . Smoking status: Former Smoker    Packs/day: 0.50    Years: 40.00    Types: Cigarettes    Quit date: 02/2017  . Smokeless tobacco: Current User  . Alcohol  use Yes     Comment: couple beers once a month  . Drug use: No  . Sexual activity: Not on file   Other Topics Concern  . Not on file   Social History Narrative  . No narrative on file    History  Smoking Status  . Former Smoker  . Packs/day: 0.50  . Years: 40.00  . Types: Cigarettes  . Quit date: 02/2017  Smokeless Tobacco  . Current User    History  Alcohol Use  . Yes    Comment: couple beers once a month     Allergies  Allergen Reactions  . Penicillins     rash  . Simvastatin Other (See Comments)    myalgias    Current Outpatient Prescriptions  Medication Sig Dispense Refill  . aspirin EC 81 MG tablet Take 81 mg by mouth daily.    Marland Kitchen ezetimibe (ZETIA) 10 MG tablet Take 10 mg by mouth daily.    . fluticasone (FLONASE) 50 MCG/ACT nasal spray Place 1 spray into both nostrils daily as needed for allergies or rhinitis.    Marland Kitchen lisinopril-hydrochlorothiazide (PRINZIDE,ZESTORETIC) 20-25 MG tablet Take 0.5 tablets by mouth daily. (Patient taking differently: Take 1 tablet by mouth daily. )    . nicotine (NICODERM CQ - DOSED IN  MG/24 HOURS) 21 mg/24hr patch Place 21 mg onto the skin daily.    Marland Kitchen oxyCODONE (OXY IR/ROXICODONE) 5 MG immediate release tablet Take 1-2 tablets (5-10 mg total) by mouth every 4 (four) hours as needed for moderate pain. 30 tablet 0  . saccharomyces boulardii (FLORASTOR) 250 MG capsule Take 250 mg by mouth 2 (two) times daily.     No current facility-administered medications for this visit.     Pertinent items are noted in HPI.   Review of Systems:     Cardiac Review of Systems: Y or N  Chest Pain [n    ]  Resting SOB [   n] Exertional SOB  [some  ]  Orthopnea [ n ]   Pedal Edema [n   ]    Palpitations [ n ] Syncope  [ n ]   Presyncope [  n ]  General Review of Systems: [Y] = yes [  ]=no Constitional: recent weight change [n  ];  Wt loss over the last 3 months [   ] anorexia [  ]; fatigue [  ]; nausea [  ]; night sweats [  ]; fever [n  ]; or chills [  ];          Dental: poor dentition[  ]; Last Dentist visit:   Eye : blurred vision n[  ]; diplopia [ n  ]; vision changes [n  ];  Amaurosis fugax[n  ]; Resp: cough [ n ];  wheezing[n  ];  hemoptysis[ n ]; shortness of breath[y  ]; paroxysmal nocturnal dyspnea[  ]; dyspnea on exertion[  ]; or orthopnea[  ];  GI:  gallstones[  ], vomiting[ n ];  dysphagia[  ]; melena[n  ];  hematochezia [  ]; heartburn[  ];   Hx of  Colonoscopy[  ]; GU: kidney stones [  ]; hematuria[ n ];   dysuria [  ];  nocturia[ n ];  history of     obstruction [  ]; urinary frequency [  ]             Skin: rash, swelling[  ];, hair loss[  ];  peripheral edema[n  ];  or itching[  ];  Musculosketetal: myalgias[  ];  joint swelling[  ];  joint erythema[  ];  joint pain[  ];  back pain[  ];  Heme/Lymph: bruising[  ];  bleeding[  ];  anemia[  ];  Neuro: TIA[ n ];  headaches[ n ];  stroke[ n ];  vertigo[  ];  seizures[  ];   paresthesias[  ];  difficulty walking[  ];  Psych:depression[  ]; anxiety[  ];  Endocrine: diabetes[ n ];  thyroid dysfunction[ n ];  Immunizations: Flu up to  date Blue.Reese  ]; Pneumococcal up to date Florencio.Farrier  ];  Other:  Physical Exam: BP 123/88   Pulse 68   Temp 98.4 F (36.9 C)   Resp 20   Wt 220 lb 6.4 oz (100 kg)   SpO2 100%   BMI 31.62 kg/m   PHYSICAL EXAMINATION: General appearance: alert, cooperative, appears stated age and no distress Head: Normocephalic, without obvious abnormality, atraumatic Neck: no adenopathy, no carotid bruit, no JVD, supple, symmetrical, trachea midline, thyroid not enlarged, symmetric, no tenderness/mass/nodules and known hypermetabolic paroitd mass on left is not palapable Lymph nodes: Cervical, supraclavicular, and axillary nodes normal. Resp: clear to auscultation bilaterally Back: symmetric, no curvature. ROM normal. No CVA tenderness. Cardio: regular rate and rhythm, S1, S2 normal, no murmur, click, rub or gallop GI: soft, non-tender; bowel sounds normal; no masses,  no organomegaly Extremities: extremities normal, atraumatic, no cyanosis or edema Neurologic: Grossly normal Well healed lower abdominal midline incision well healed without infection  Diagnostic Studies & Laboratory data:     Recent Radiology Findings:   Ct Chest Wo Contrast  Result Date: 06/14/2017 CLINICAL DATA:  Follow-up nodule EXAM: CT CHEST WITHOUT CONTRAST TECHNIQUE: Multidetector CT imaging of the chest was performed following the standard protocol without IV contrast. COMPARISON:  02/28/2017 CT I FINDINGS: Cardiovascular: No evidence of aortic aneurysm or intramural hematoma. Mediastinum/Nodes: No abnormal mediastinal adenopathy. Visualized thyroid is unremarkable. Esophagus is unremarkable. No pericardial effusion. Lungs/Pleura: Left lower lobe mixed solid and ground-glass opacity on image 88 measures 1.4 cm and is stable dating back to February 13th of this year. The solid component measures 8 mm. No new nodule. Upper Abdomen: No acute abnormality. Musculoskeletal: No acute bony pathology. IMPRESSION: Persistence solid and sub solid left  lower lobe nodule. The solid component is 8 mm. This nodule is highly suspicious for malignancy. Thoracic surgery consultation is recommended. Follow-up non-contrast CT recommended at 3-6 months to confirm persistence. If unchanged, and solid component remains <6 mm, annual CT is recommended until 5 years of stability has been established. If persistent these nodules should be considered highly suspicious if the solid component of the nodule is 6 mm or greater in size and enlarging. This recommendation follows the consensus statement: Guidelines for Management of Incidental Pulmonary Nodules Detected on CT Images: From the Fleischner Society 2017; Radiology 2017; 284:228-243. None Electronically Signed   By: Marybelle Killings M.D.   On: 06/14/2017 09:12   Nm Pet Image Initial (pi) Skull Base To Thigh  Result Date: 06/25/2017 CLINICAL DATA:  Initial treatment strategy for persistent subsolid left lower lobe pulmonary nodule. Status post laparoscopic sigmoid colectomy 05/17/2017 for recurrent sigmoid diverticulitis. EXAM: NUCLEAR MEDICINE PET SKULL BASE TO THIGH TECHNIQUE: 10.9 mCi F-18 FDG was injected intravenously. Full-ring PET imaging was performed from the skull base to thigh after the radiotracer. CT data was obtained and used for attenuation correction and anatomic localization. FASTING BLOOD GLUCOSE:  Value: 103 mg/dl COMPARISON:  06/14/2017 chest CT.  02/28/2017 CT abdomen/pelvis. FINDINGS: NECK: No hypermetabolic lymph nodes in the neck. Hypermetabolic 1.1 cm mass centered at the inferior margin of the deep lobe of the left parotid gland (series 4/image 26) with max SUV 7.4. Symmetric hypermetabolism in Waldeyer's ring without discrete mass correlate on the CT images, probably reactive. CHEST: Subsolid irregular 1.2 cm left lower lobe pulmonary nodule (series 8/image 40) demonstrates no significant metabolism (max SUV 0.7). No acute consolidative airspace disease, lung masses or additional significant  pulmonary nodules. No enlarged or hypermetabolic axillary, mediastinal or hilar lymph nodes. No pneumothorax. No pleural effusions. ABDOMEN/PELVIS: No abnormal hypermetabolic activity within the liver, pancreas, adrenal glands, or spleen. No hypermetabolic lymph nodes in the abdomen or pelvis. Fat stranding in the midline subcutaneous ventral abdominal wall with associated mild patchy hypermetabolism is compatible with expected postsurgical inflammatory change. No superficial fluid collections. Status post subtotal distal colectomy with intact appearing distal colonic anastomosis. Mild scattered diverticulosis throughout the remnant colon. Atherosclerotic nonaneurysmal abdominal aorta. Mild diffuse hepatic steatosis. Small simple renal cysts in both kidneys, largest 1.3 cm in the medial upper right kidney. Top-normal size prostate. SKELETON: No focal hypermetabolic activity to suggest skeletal metastasis. IMPRESSION: 1. Subsolid 1.2 cm left lower lobe pulmonary nodule demonstrates no significant metabolism. Differential continues to include low grade lung adenocarcinoma. Thoracic surgical consultation advised for consideration of surgical resection. Annual CT is recommended until 5 years of stability has been established if surgery is not elected. This recommendation follows the consensus statement: Guidelines for Management of Incidental Pulmonary Nodules Detected on CT Images: From the Fleischner Society 2017; Radiology 2017; 284:228-243. 2. Hypermetabolic 1.1 cm mass centered at the inferior margin of the deep lobe of the left parotid gland, suspicious for primary left parotid neoplasm, potentially malignant. Dedicated left parotid ultrasound with ultrasound-guided biopsy recommended. 3. Expected postsurgical changes in the midline ventral abdominal wall from interval subtotal distal colectomy. No fluid collections. Electronically Signed   By: Ilona Sorrel M.D.   On: 06/25/2017 12:05     I have independently  reviewed the above radiologic studies.  Recent Lab Findings: Lab Results  Component Value Date   WBC 12.8 (H) 05/18/2017   HGB 14.3 05/18/2017   HCT 42.3 05/18/2017   PLT 319 05/18/2017   GLUCOSE 109 (H) 05/18/2017   ALT 42 02/28/2017   AST 39 02/28/2017   NA 135 05/18/2017   K 3.8 05/18/2017   CL 104 05/18/2017   CREATININE 1.00 05/18/2017   BUN 7 05/18/2017   CO2 22 05/18/2017   INR 1.11 03/01/2017   HGBA1C 6.0 (H) 05/08/2017      Assessment / Plan:   1/Left lower lobe mixed solid and ground-glass opacity on image 88 measures 1.4 cm and is stable dating back to February 2018, left lower lobe pulmonary nodule demonstrates no significant metabolism. Differentia lincludes  low grade lung adenocarcinoma.  2/Hypermetabolic 1.1 cm mass centered at the inferior margin of the deep lobe of the left parotid gland     I have dicussed with patient and his wife the ct and pet findings, lung lesion may be slow growing lung malignancy. WE discussed options of resection vs attempt to biopsy with navigation bronchoscopy vs short interval follow up ct.  Plan: Full PFT's and see back to decide on surgery for lung resection vs navigation bronchoscopy and biopsy.  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 The total time spent in the appointment was 60 minutes.  Grace Isaac MD      [address] [place] [address] Office [phone]   Beeper [phone]  06/27/2017 7:43 PM

## 2017-06-27 NOTE — Patient Instructions (Signed)
Pulmonary Nodule A pulmonary nodule is a small, round growth of tissue in the lung. Pulmonary nodules can range in size from less than 1/5 inch (4 mm) to a little bigger than an inch (25 mm). Most pulmonary nodules are detected when imaging tests of the lung are being performed for a different problem. Pulmonary nodules are usually not cancerous (benign). However, some pulmonary nodules are cancerous (malignant). Follow-up treatment or testing is based on the size of the pulmonary nodule and your risk of getting lung cancer. What are the causes? Benign pulmonary nodules can be caused by various things. Some of the causes include:  Bacterial, fungal, or viral infections. This is usually an old infection that is no longer active, but it can sometimes be a current, active infection.  A benign mass of tissue.  Inflammation from conditions such as rheumatoid arthritis.  Abnormal blood vessels in the lungs. Malignant pulmonary nodules can result from lung cancer or from cancers that spread to the lung from other places in the body. What are the signs or symptoms? Pulmonary nodules usually do not cause symptoms. How is this diagnosed? Most often, pulmonary nodules are found incidentally when an X-ray or CT scan is performed to look for some other problem in the lung area. To help determine whether a pulmonary nodule is benign or malignant, your health care provider will take a medical history and order a variety of tests. Tests done may include:  Blood tests.  A skin test called a tuberculin test. This test is used to determine if you have been exposed to the germ that causes tuberculosis.  Chest X-rays. If possible, a new X-ray may be compared with X-rays you have had in the past.  CT scan. This test shows smaller pulmonary nodules more clearly than an X-ray.  Positron emission tomography (PET) scan. In this test, a safe amount of a radioactive substance is injected into the bloodstream. Then,  the scan takes a picture of the pulmonary nodule. The radioactive substance is eliminated from your body in your urine.  Biopsy. A tiny piece of the pulmonary nodule is removed so it can be checked under a microscope. How is this treated? Pulmonary nodules that are benign normally do not require any treatment because they usually do not cause symptoms or breathing problems. Your health care provider may want to monitor the pulmonary nodule through follow-up CT scans. The frequency of these CT scans will vary based on the size of the nodule and the risk factors for lung cancer. For example, CT scans will need to be done more frequently if the pulmonary nodule is larger and if you have a history of smoking and a family history of cancer. Further testing or biopsies may be done if any follow-up CT scan shows that the size of the pulmonary nodule has increased. Follow these instructions at home:  Only take over-the-counter or prescription medicines as directed by your health care provider.  Keep all follow-up appointments with your health care provider. Contact a health care provider if:  You have trouble breathing when you are active.  You feel sick or unusually tired.  You do not feel like eating.  You lose weight without trying to.  You develop chills or night sweats. Get help right away if:  You cannot catch your breath, or you begin wheezing.  You cannot stop coughing.  You cough up blood.  You become dizzy or feel like you are going to pass out.  You have sudden   chest pain.  You have a fever or persistent symptoms for more than 2-3 days.  You have a fever and your symptoms suddenly get worse. This information is not intended to replace advice given to you by your health care provider. Make sure you discuss any questions you have with your health care provider. Document Released: 08/26/2009 Document Revised: 04/05/2016 Document Reviewed: 04/20/2013 Elsevier Interactive Patient  Education  2017 [employer]   Lung Cancer Lung cancer occurs when abnormal cells in the lung grow out of control and form a mass (tumor). There are several types of lung cancer. The two most common types are:  Non-small cell. In this type of lung cancer, abnormal cells are larger and grow more slowly than those of small cell lung cancer.  Small cell. In this type of lung cancer, abnormal cells are smaller than those of non-small cell lung cancer. Small cell lung cancer gets worse faster than non-small cell lung cancer. What are the causes? The leading cause of lung cancer is smoking tobacco. The second leading cause is radon exposure. What increases the risk?  Smoking tobacco.  Exposure to secondhand tobacco smoke.  Exposure to radon gas.  Exposure to asbestos.  Exposure to arsenic in drinking water.  Air pollution.  Family or personal history of lung cancer.  Lung radiation therapy.  Being older than 65 years. What are the signs or symptoms? In the early stages, symptoms may not be present. As the cancer progresses, symptoms may include:  A lasting cough, possibly with blood.  Fatigue.  Unexplained weight loss.  Shortness of breath.  Wheezing.  Chest pain.  Loss of appetite. Symptoms of advanced lung cancer include:  Hoarseness.  Bone or joint pain.  Weakness.  Nail problems.  Face or arm swelling.  Paralysis of the face.  Drooping eyelids. How is this diagnosed? Lung cancer can be identified with a physical exam and with tests such as:  A chest X-ray.  A CT scan.  Blood tests.  A biopsy. After a diagnosis is made, you will have more tests to determine the stage of the cancer. The stages of non-small cell lung cancer are:  Stage 0, also called carcinoma in situ. At this stage, abnormal cells are found in the inner lining of your lung or lungs.  Stage I. At this stage, abnormal cells have grown into a tumor that is no larger than 5 cm  across. The cancer has entered the deeper lung tissue but has not yet entered the lymph nodes or other parts of the body.  Stage II. At this stage, the tumor is 7 cm across or smaller and has entered nearby lymph nodes. Or, the tumor is 5 cm across or smaller and has invaded surrounding tissue but is not found in nearby lymph nodes. There may be more than one tumor present.  Stage III. At this stage, the tumor may be any size. There may be more than one tumor in the lungs. The cancer cells have spread to the lymph nodes and possibly to other organs.  Stage IV. At this stage, there are tumors in both lungs and the cancer has spread to other areas of the body. The stages of small cell lung cancer are:  Limited. At this stage, the cancer is found only on one side of the chest.  Extensive. At this stage, the cancer is in the lungs and in tissues on the other side of the chest. The cancer has spread to other organs or   is found in the fluid between the layers of your lungs. How is this treated? Depending on the type and stage of your lung cancer, you may be treated with:  Surgery. This is done to remove a tumor.  Radiation therapy. This treatment destroys cancer cells using X-rays or other types of radiation.  Chemotherapy. This treatment uses medicines to destroy cancer cells.  Targeted therapy. This treatment aims to destroy only cancer cells instead of all cells as other therapies do. You may also have a combination of treatments. Follow these instructions at home:  Do not use any tobacco products. This includes cigarettes, chewing tobacco, and electronic cigarettes. If you need help quitting, ask your health care provider.  Take medicines only as directed by your health care provider.  Eat a healthy diet. Work with a dietitian to make sure you are getting the nutrition you need.  Consider joining a support group or seeking counseling to help you cope with the stress of having lung  cancer.  Let your cancer specialist (oncologist) know if you are admitted to the hospital.  Keep all follow-up visits as directed by your health care provider. This is important. Contact a health care provider if:  You lose weight without trying.  You have a persistent cough and wheezing.  You feel short of breath.  You tire easily.  You experience bone or joint pain.  You have difficulty swallowing.  You feel hoarse or notice your voice changing.  Your pain medicine is not helping. Get help right away if:  You cough up blood.  You have new breathing problems.  You develop chest pain.  You develop swelling in:  One or both ankles or legs.  Your face, neck, or arms.  You are confused.  You experience paralysis in your face or a drooping eyelid. This information is not intended to replace advice given to you by your health care provider. Make sure you discuss any questions you have with your health care provider. Document Released: 02/04/2001 Document Revised: 04/05/2016 Document Reviewed: 03/04/2014 Elsevier Interactive Patient Education  2017 [employer]   

## 2017-06-28 ENCOUNTER — Encounter: Payer: Self-pay | Admitting: *Deleted

## 2017-06-28 ENCOUNTER — Other Ambulatory Visit: Payer: Self-pay | Admitting: *Deleted

## 2017-06-28 DIAGNOSIS — R911 Solitary pulmonary nodule: Secondary | ICD-10-CM

## 2017-06-28 DIAGNOSIS — Z01818 Encounter for other preprocedural examination: Secondary | ICD-10-CM

## 2017-06-28 NOTE — Progress Notes (Signed)
Oncology Nurse Navigator Documentation  Oncology Nurse Navigator Flowsheets 06/28/2017  Navigator Location [facility]  Navigator Encounter Type Other/Spoke with patient yesterday and updated him on next steps. Today, I called [facility] office to get an update on orders Dr. Servando Snare wrote yesterday.  Patient is set up for CT Super D, FPT and follow up with Dr. Servando Snare.  Jana Half at [facility] is still working on ENT appt.   Abnormal Finding Date 02/28/2017  [facility] Date 06/27/2017  Patient Visit Type MedOnc  Treatment Phase Abnormal Scans  Barriers/Navigation Needs Coordination of Care;Education  Education Other  Interventions Coordination of Care;Education  Coordination of Care Other  Education Method Verbal  Acuity Level 2  Time Spent with Patient 30

## 2017-07-02 ENCOUNTER — Encounter: Payer: Self-pay | Admitting: *Deleted

## 2017-07-02 NOTE — Progress Notes (Signed)
Oncology Nurse Navigator Documentation  Oncology Nurse Navigator Flowsheets 07/02/2017  Navigator Location [facility]  Navigator Encounter Type Other/I called [facility] office and was updated on [facility] ENT appt.  I updated Dr. Servando Snare.  Treatment Phase Abnormal Scans  Barriers/Navigation Needs Coordination of Care  Education Other  Interventions Coordination of Care  Coordination of Care Other  Acuity Level 2  Time Spent with Patient 15

## 2017-07-03 ENCOUNTER — Ambulatory Visit (HOSPITAL_COMMUNITY)
Admission: RE | Admit: 2017-07-03 | Discharge: 2017-07-03 | Disposition: A | Discharge status: Home / Self Care | Payer: 59 | Source: Ambulatory Visit | Attending: Cardiothoracic Surgery | Admitting: Cardiothoracic Surgery

## 2017-07-03 ENCOUNTER — Ambulatory Visit
Admission: RE | Admit: 2017-07-03 | Discharge: 2017-07-03 | Disposition: A | Discharge status: Home / Self Care | Payer: 59 | Source: Ambulatory Visit | Attending: Cardiothoracic Surgery | Admitting: Cardiothoracic Surgery

## 2017-07-03 DIAGNOSIS — R911 Solitary pulmonary nodule: Secondary | ICD-10-CM

## 2017-07-03 DIAGNOSIS — Z01818 Encounter for other preprocedural examination: Secondary | ICD-10-CM | POA: Diagnosis not present

## 2017-07-03 LAB — PULMONARY FUNCTION TEST
DL/VA % pred: 85 %
DL/VA: 3.96 ml/min/mmHg/L
DLCO unc % pred: 81 %
DLCO unc: 26.38 ml/min/mmHg
FEF 25-75 Post: 2.3 L/sec
FEF 25-75 Pre: 1.85 L/sec
FEF2575-%Change-Post: 24 %
FEF2575-%Pred-Post: 71 %
FEF2575-%Pred-Pre: 57 %
FEV1-%Change-Post: 7 %
FEV1-%Pred-Post: 78 %
FEV1-%Pred-Pre: 73 %
FEV1-Post: 2.97 L
FEV1-Pre: 2.77 L
FEV1FVC-%Change-Post: 0 %
FEV1FVC-%Pred-Pre: 86 %
FEV6-%Change-Post: 7 %
FEV6-%Pred-Post: 92 %
FEV6-%Pred-Pre: 86 %
FEV6-Post: 4.39 L
FEV6-Pre: 4.09 L
FEV6FVC-%Change-Post: 0 %
FEV6FVC-%Pred-Post: 103 %
FEV6FVC-%Pred-Pre: 103 %
FVC-%Change-Post: 6 %
FVC-%Pred-Post: 89 %
FVC-%Pred-Pre: 84 %
FVC-Post: 4.42 L
FVC-Pre: 4.14 L
Post FEV1/FVC ratio: 67 %
Post FEV6/FVC ratio: 99 %
Pre FEV1/FVC ratio: 67 %
Pre FEV6/FVC Ratio: 99 %
RV % pred: 175 %
RV: 3.76 L
TLC % pred: 113 %
TLC: 7.89 L

## 2017-07-03 MED ORDER — ALBUTEROL SULFATE (2.5 MG/3ML) 0.083% IN NEBU
2.5000 mg | INHALATION_SOLUTION | Freq: Once | RESPIRATORY_TRACT | Status: AC
  Administered 2017-07-03: 2.5 mg via RESPIRATORY_TRACT

## 2017-07-18 ENCOUNTER — Ambulatory Visit (INDEPENDENT_AMBULATORY_CARE_PROVIDER_SITE_OTHER): Payer: 59 | Admitting: Cardiothoracic Surgery

## 2017-07-18 VITALS — BP 112/79 | HR 69 | Resp 16 | Ht 70.0 in | Wt 223.2 lb

## 2017-07-18 DIAGNOSIS — D490 Neoplasm of unspecified behavior of digestive system: Secondary | ICD-10-CM | POA: Diagnosis not present

## 2017-07-18 DIAGNOSIS — R918 Other nonspecific abnormal finding of lung field: Secondary | ICD-10-CM

## 2017-07-18 NOTE — Progress Notes (Signed)
[address]       [place] [address]             [phone]                    [facility] Medical Record #[id number] Date of Birth: 1961-07-26  Referring: Kelton Pillar, MD Primary Care: Kelton Pillar, MD  Chief Complaint:   Left lung Mass  History of Present Illness:    Dakota Santos 56 y.o. male is seen in the office  today for left lung mass noted incidentially on ct of abdomen done in Feb 2018 for abdominal pain. Ultimitaly patient had repeated episodes of abdominal pain since February. In July he underwent sigmoid colon resection for repeated episodes of diverticulitis.  Patient referred for follow up of left line lesion, complete ct of chest done and PET. Patient is long term smoker more then 40 years, he quit in April 2018.   Patient has previous dx of COPD, he had been prescribed Spiriva, but could not tolerate and stop using. He has no pulmonary symptoms at rest does note sob if climbs two flights of stairs  No previous cardiac history  Since last seen in the office the patient has had a super D CT of the chest An full pulmonary function studies  Has an appointment in ENT tomorrow to evaluate the left parotid gland  Current Activity/ Functional Status:  Patient is independent with mobility/ambulation, transfers, ADL's, IADL's.   Zubrod Score: At the time of surgery this patient's most appropriate activity status/level should be described as: []     0    Normal activity, no symptoms [x]     1    Restricted in physical strenuous activity but ambulatory, able to do out light work []     2    Ambulatory and capable of self care, unable to do work activities, up and about               >50 % of waking hours                              []     3    Only limited self care, in bed greater than 50% of waking hours []     4    Completely disabled, no self care, confined to bed or chair []     5    Moribund   Past Medical History:    Diagnosis Date  . Diverticulitis   . Hyperlipidemia   . Hypertension     Past Surgical History:  Procedure Laterality Date  . HERNIA REPAIR    . LAPAROSCOPIC SIGMOID COLECTOMY N/A 05/17/2017   Procedure: LAPAROSCOPIC ASSISTED SIGMOID COLECTOMY;  Surgeon: Coralie Keens, MD;  Location: [facility];  Service: General;  Laterality: N/A;  . ORIF ELBOW FRACTURE Left 09/18/2014   Procedure: OPEN REDUCTION INTERNAL FIXATION (ORIF) ELBOW/OLECRANON FRACTURE LEFT;  Surgeon: Nita Sells, MD;  Location: [place];  Service: Orthopedics;  Laterality: Left;    Family History  Problem Relation Age of Onset  . Atrial fibrillation Mother   . Prostate cancer Father     Social History   Social History  . Marital status: Married    Spouse name: N/A  . Number of children: N/A  . Years of education: N/A   Occupational History  . chief Chief Financial Officer    Social History Main  Topics  . Smoking status: Former Smoker    Packs/day: 0.50    Years: 40.00    Types: Cigarettes    Quit date: 02/2017  . Smokeless tobacco: Current User  . Alcohol use Yes     Comment: couple beers once a month  . Drug use: No  . Sexual activity: Not on file   Other Topics Concern  . Not on file   Social History Narrative  . No narrative on file    History  Smoking Status  . Former Smoker  . Packs/day: 0.50  . Years: 40.00  . Types: Cigarettes  . Quit date: 02/2017  Smokeless Tobacco  . Current User    History  Alcohol Use  . Yes    Comment: couple beers once a month     Allergies  Allergen Reactions  . Penicillins     rash  . Simvastatin Other (See Comments)    myalgias    Current Outpatient Prescriptions  Medication Sig Dispense Refill  . aspirin EC 81 MG tablet Take 81 mg by mouth daily.    Marland Kitchen ezetimibe (ZETIA) 10 MG tablet Take 10 mg by mouth daily.    . fluticasone (FLONASE) 50 MCG/ACT nasal spray Place 1 spray into both nostrils daily as needed for allergies or rhinitis.    Marland Kitchen  lisinopril-hydrochlorothiazide (PRINZIDE,ZESTORETIC) 20-25 MG tablet Take 0.5 tablets by mouth daily. (Patient taking differently: Take 1 tablet by mouth daily. )     No current facility-administered medications for this visit.     Pertinent items are noted in HPI.   Review of Systems:     Cardiac Review of Systems: Y or N  Chest Pain [n    ]  Resting SOB [   n] Exertional SOB  [some  ]  Orthopnea [ n ]   Pedal Edema [n   ]    Palpitations [ n ] Syncope  [ n ]   Presyncope [  n ]  General Review of Systems: [Y] = yes [  ]=no Constitional: recent weight change [n  ];  Wt loss over the last 3 months [   ] anorexia [  ]; fatigue [  ]; nausea [  ]; night sweats [  ]; fever [n  ]; or chills [  ];          Dental: poor dentition[  ]; Last Dentist visit:   Eye : blurred vision n[  ]; diplopia [ n  ]; vision changes [n  ];  Amaurosis fugax[n  ]; Resp: cough [ n ];  wheezing[n  ];  hemoptysis[ n ]; shortness of breath[y  ]; paroxysmal nocturnal dyspnea[  ]; dyspnea on exertion[  ]; or orthopnea[  ];  GI:  gallstones[  ], vomiting[ n ];  dysphagia[  ]; melena[n  ];  hematochezia [  ]; heartburn[  ];   Hx of  Colonoscopy[  ]; GU: kidney stones [  ]; hematuria[ n ];   dysuria [  ];  nocturia[ n ];  history of     obstruction [  ]; urinary frequency [  ]             Skin: rash, swelling[  ];, hair loss[  ];  peripheral edema[n  ];  or itching[  ]; Musculosketetal: myalgias[  ];  joint swelling[  ];  joint erythema[  ];  joint pain[  ];  back pain[  ];  Heme/Lymph: bruising[  ];  bleeding[  ];  anemia[  ];  Neuro: TIA[ n ];  headaches[ n ];  stroke[ n ];  vertigo[  ];  seizures[  ];   paresthesias[  ];  difficulty walking[  ];  Psych:depression[  ]; anxiety[  ];  Endocrine: diabetes[ n ];  thyroid dysfunction[ n ];  Immunizations: Flu up to date Blue.Reese  ]; Pneumococcal up to date Florencio.Farrier  ];  Other:  Physical Exam: BP 112/79   Pulse 69   Resp 16   Ht 5\' 10"  (1.778 m)   Wt 223 lb 3.2 oz (101.2 kg)   SpO2  95% Comment: ON RA  BMI 32.03 kg/m   PHYSICAL EXAMINATION: General appearance: alert, cooperative, appears stated age and no distress Head: Normocephalic, without obvious abnormality, atraumatic Neck: no adenopathy, no carotid bruit, no JVD, supple, symmetrical, trachea midline, thyroid not enlarged, symmetric, no tenderness/mass/nodules and known hypermetabolic paroitd mass on left is not palapable Lymph nodes: Cervical, supraclavicular, and axillary nodes normal. Resp: clear to auscultation bilaterally Back: symmetric, no curvature. ROM normal. No CVA tenderness. Cardio: regular rate and rhythm, S1, S2 normal, no murmur, click, rub or gallop GI: soft, non-tender; bowel sounds normal; no masses,  no organomegaly Extremities: extremities normal, atraumatic, no cyanosis or edema Neurologic: Grossly normal Lower abdominal incision from his recent our section is well-healed.  Diagnostic Studies & Laboratory data:     Recent Radiology Findings:   Ct Super D Chest Wo Contrast  Result Date: 07/03/2017 CLINICAL DATA:  Follow up pulmonary nodule. Previous colectomy. No history of malignancy. Former smoker. EXAM: CT CHEST WITHOUT CONTRAST TECHNIQUE: Multidetector CT imaging of the chest was performed using thin slice collimation for electromagnetic bronchoscopy planning purposes, without intravenous contrast. COMPARISON:  Chest CT 06/14/2017. PET-CT 06/25/2017. Abdominal CT 12/25/2016 and 02/28/2017. FINDINGS: Cardiovascular: No significant vascular findings on noncontrast imaging. The heart size is normal. There is no pericardial effusion. Mediastinum/Nodes: There are no enlarged mediastinal, hilar or axillary lymph nodes. The thyroid gland, trachea and esophagus demonstrate no significant findings. Lungs/Pleura: There is no pleural effusion. The sub solid left lower lobe nodule is unchanged from the recent prior studies, measuring 11 x 7 mm on image 71. There are no new or enlarging pulmonary  nodules. There is no endobronchial lesion. Upper abdomen: Borderline hepatic steatosis. No evidence of adrenal mass. Probable small cysts in the upper pole of the right kidney. Musculoskeletal/Chest wall: There is no chest wall mass or suspicious osseous finding. IMPRESSION: 1. Follow-up imaging for electro magnetic bronchoscopy planning. 2. The sub solid left lower lobe nodule is unchanged from the recent prior studies. 3. No acute findings. Electronically Signed   By: Richardean Sale M.D.   On: 07/03/2017 10:53   I have independently reviewed the above radiology studies  and reviewed the findings with the patient.    Ct Chest Wo Contrast  Result Date: 06/14/2017 CLINICAL DATA:  Follow-up nodule EXAM: CT CHEST WITHOUT CONTRAST TECHNIQUE: Multidetector CT imaging of the chest was performed following the standard protocol without IV contrast. COMPARISON:  02/28/2017 CT I FINDINGS: Cardiovascular: No evidence of aortic aneurysm or intramural hematoma. Mediastinum/Nodes: No abnormal mediastinal adenopathy. Visualized thyroid is unremarkable. Esophagus is unremarkable. No pericardial effusion. Lungs/Pleura: Left lower lobe mixed solid and ground-glass opacity on image 88 measures 1.4 cm and is stable dating back to February 13th of this year. The solid component measures 8 mm. No new nodule. Upper Abdomen: No acute abnormality. Musculoskeletal: No acute bony pathology. IMPRESSION: Persistence solid and sub solid left lower lobe  nodule. The solid component is 8 mm. This nodule is highly suspicious for malignancy. Thoracic surgery consultation is recommended. Follow-up non-contrast CT recommended at 3-6 months to confirm persistence. If unchanged, and solid component remains <6 mm, annual CT is recommended until 5 years of stability has been established. If persistent these nodules should be considered highly suspicious if the solid component of the nodule is 6 mm or greater in size and enlarging. This  recommendation follows the consensus statement: Guidelines for Management of Incidental Pulmonary Nodules Detected on CT Images: From the Fleischner Society 2017; Radiology 2017; 284:228-243. None Electronically Signed   By: Marybelle Killings M.D.   On: 06/14/2017 09:12   Nm Pet Image Initial (pi) Skull Base To Thigh  Result Date: 06/25/2017 CLINICAL DATA:  Initial treatment strategy for persistent subsolid left lower lobe pulmonary nodule. Status post laparoscopic sigmoid colectomy 05/17/2017 for recurrent sigmoid diverticulitis. EXAM: NUCLEAR MEDICINE PET SKULL BASE TO THIGH TECHNIQUE: 10.9 mCi F-18 FDG was injected intravenously. Full-ring PET imaging was performed from the skull base to thigh after the radiotracer. CT data was obtained and used for attenuation correction and anatomic localization. FASTING BLOOD GLUCOSE:  Value: 103 mg/dl COMPARISON:  06/14/2017 chest CT.  02/28/2017 CT abdomen/pelvis. FINDINGS: NECK: No hypermetabolic lymph nodes in the neck. Hypermetabolic 1.1 cm mass centered at the inferior margin of the deep lobe of the left parotid gland (series 4/image 26) with max SUV 7.4. Symmetric hypermetabolism in Waldeyer's ring without discrete mass correlate on the CT images, probably reactive. CHEST: Subsolid irregular 1.2 cm left lower lobe pulmonary nodule (series 8/image 40) demonstrates no significant metabolism (max SUV 0.7). No acute consolidative airspace disease, lung masses or additional significant pulmonary nodules. No enlarged or hypermetabolic axillary, mediastinal or hilar lymph nodes. No pneumothorax. No pleural effusions. ABDOMEN/PELVIS: No abnormal hypermetabolic activity within the liver, pancreas, adrenal glands, or spleen. No hypermetabolic lymph nodes in the abdomen or pelvis. Fat stranding in the midline subcutaneous ventral abdominal wall with associated mild patchy hypermetabolism is compatible with expected postsurgical inflammatory change. No superficial fluid collections.  Status post subtotal distal colectomy with intact appearing distal colonic anastomosis. Mild scattered diverticulosis throughout the remnant colon. Atherosclerotic nonaneurysmal abdominal aorta. Mild diffuse hepatic steatosis. Small simple renal cysts in both kidneys, largest 1.3 cm in the medial upper right kidney. Top-normal size prostate. SKELETON: No focal hypermetabolic activity to suggest skeletal metastasis. IMPRESSION: 1. Subsolid 1.2 cm left lower lobe pulmonary nodule demonstrates no significant metabolism. Differential continues to include low grade lung adenocarcinoma. Thoracic surgical consultation advised for consideration of surgical resection. Annual CT is recommended until 5 years of stability has been established if surgery is not elected. This recommendation follows the consensus statement: Guidelines for Management of Incidental Pulmonary Nodules Detected on CT Images: From the Fleischner Society 2017; Radiology 2017; 284:228-243. 2. Hypermetabolic 1.1 cm mass centered at the inferior margin of the deep lobe of the left parotid gland, suspicious for primary left parotid neoplasm, potentially malignant. Dedicated left parotid ultrasound with ultrasound-guided biopsy recommended. 3. Expected postsurgical changes in the midline ventral abdominal wall from interval subtotal distal colectomy. No fluid collections. Electronically Signed   By: Ilona Sorrel M.D.   On: 06/25/2017 12:05     I have independently reviewed the above radiologic studies.  Recent Lab Findings: Lab Results  Component Value Date   WBC 12.8 (H) 05/18/2017   HGB 14.3 05/18/2017   HCT 42.3 05/18/2017   PLT 319 05/18/2017   GLUCOSE 109 (H) 05/18/2017  ALT 42 02/28/2017   AST 39 02/28/2017   NA 135 05/18/2017   K 3.8 05/18/2017   CL 104 05/18/2017   CREATININE 1.00 05/18/2017   BUN 7 05/18/2017   CO2 22 05/18/2017   INR 1.11 03/01/2017   HGBA1C 6.0 (H) 05/08/2017   PFT's FEV1 2.77 73% DLCO 26  81%  Conclusions: Minimal airway obstruction is present suggesting small airway disease. Pulmonary Function Diagnosis: Mild Obstructive Airways Disease Insignificant response to bronchodilator Normal Difusion   Assessment / Plan:   1/Left lower lobe mixed solid and ground-glass opacity on image 88 measures 1.4 cm and is stable dating back to February 2018, left lower lobe pulmonary nodule demonstrates no significant metabolism. Differentia lincludes  low grade lung adenocarcinoma. I reviewed this finding with patient in detail and discussed with him the option of proceeding with navigation bronchoscopy and attempted biopsy, versus immediate resection, versus follow-up short-term CT scan in 3 months. The patient is clear that although this lesion is not hypermetabolic still has a high probability of reflecting a low grade adenocarcinoma the lung. The patient has deferred proceeding with immediate biopsy but is willing to return in 3 months with a follow-up CT scan, and super D format so we can re-evaluate for navigation bronchoscopy.    2/Hypermetabolic 1.1 cm mass centered at the inferior margin of the deep lobe of the left parotid gland    Grace Isaac MD      [address] [place] [address] Office [phone]   Beeper [phone]  07/18/2017 1:57 PM

## 2017-07-19 DIAGNOSIS — D381 Neoplasm of uncertain behavior of trachea, bronchus and lung: Secondary | ICD-10-CM | POA: Diagnosis not present

## 2017-07-19 DIAGNOSIS — J310 Chronic rhinitis: Secondary | ICD-10-CM | POA: Insufficient documentation

## 2017-07-19 DIAGNOSIS — K118 Other diseases of salivary glands: Secondary | ICD-10-CM | POA: Insufficient documentation

## 2017-07-19 DIAGNOSIS — D37030 Neoplasm of uncertain behavior of the parotid salivary glands: Secondary | ICD-10-CM | POA: Diagnosis not present

## 2017-07-19 DIAGNOSIS — J302 Other seasonal allergic rhinitis: Secondary | ICD-10-CM | POA: Diagnosis not present

## 2017-07-22 ENCOUNTER — Other Ambulatory Visit (HOSPITAL_COMMUNITY): Payer: Self-pay | Admitting: Otolaryngology

## 2017-07-22 DIAGNOSIS — K118 Other diseases of salivary glands: Secondary | ICD-10-CM

## 2017-08-01 ENCOUNTER — Other Ambulatory Visit: Payer: Self-pay | Admitting: Radiology

## 2017-08-01 ENCOUNTER — Other Ambulatory Visit: Payer: Self-pay | Admitting: Student

## 2017-08-02 ENCOUNTER — Other Ambulatory Visit: Payer: Self-pay | Admitting: Radiology

## 2017-08-05 ENCOUNTER — Ambulatory Visit (HOSPITAL_COMMUNITY)
Admission: RE | Admit: 2017-08-05 | Discharge: 2017-08-05 | Disposition: A | Discharge status: Home / Self Care | Payer: 59 | Source: Ambulatory Visit | Attending: Otolaryngology | Admitting: Otolaryngology

## 2017-08-05 ENCOUNTER — Other Ambulatory Visit (HOSPITAL_COMMUNITY): Payer: Self-pay | Admitting: Otolaryngology

## 2017-08-05 ENCOUNTER — Encounter (HOSPITAL_COMMUNITY): Payer: Self-pay

## 2017-08-05 DIAGNOSIS — K118 Other diseases of salivary glands: Secondary | ICD-10-CM

## 2017-08-05 DIAGNOSIS — Z9889 Other specified postprocedural states: Secondary | ICD-10-CM | POA: Insufficient documentation

## 2017-08-05 DIAGNOSIS — E785 Hyperlipidemia, unspecified: Secondary | ICD-10-CM | POA: Insufficient documentation

## 2017-08-05 DIAGNOSIS — Z87891 Personal history of nicotine dependence: Secondary | ICD-10-CM | POA: Insufficient documentation

## 2017-08-05 DIAGNOSIS — Z880 Allergy status to penicillin: Secondary | ICD-10-CM | POA: Diagnosis not present

## 2017-08-05 DIAGNOSIS — Z8042 Family history of malignant neoplasm of prostate: Secondary | ICD-10-CM | POA: Diagnosis not present

## 2017-08-05 DIAGNOSIS — Z8249 Family history of ischemic heart disease and other diseases of the circulatory system: Secondary | ICD-10-CM | POA: Diagnosis not present

## 2017-08-05 DIAGNOSIS — I10 Essential (primary) hypertension: Secondary | ICD-10-CM | POA: Insufficient documentation

## 2017-08-05 DIAGNOSIS — K119 Disease of salivary gland, unspecified: Secondary | ICD-10-CM | POA: Insufficient documentation

## 2017-08-05 DIAGNOSIS — D110 Benign neoplasm of parotid gland: Secondary | ICD-10-CM | POA: Diagnosis not present

## 2017-08-05 DIAGNOSIS — D490 Neoplasm of unspecified behavior of digestive system: Secondary | ICD-10-CM | POA: Diagnosis not present

## 2017-08-05 DIAGNOSIS — Z888 Allergy status to other drugs, medicaments and biological substances status: Secondary | ICD-10-CM | POA: Insufficient documentation

## 2017-08-05 LAB — PROTIME-INR
INR: 0.98
Prothrombin Time: 12.9 seconds (ref 11.4–15.2)

## 2017-08-05 LAB — APTT: aPTT: 31 seconds (ref 24–36)

## 2017-08-05 LAB — CBC
HCT: 46.1 % (ref 39.0–52.0)
Hemoglobin: 15.6 g/dL (ref 13.0–17.0)
MCH: 29.4 pg (ref 26.0–34.0)
MCHC: 33.8 g/dL (ref 30.0–36.0)
MCV: 87 fL (ref 78.0–100.0)
Platelets: 324 10*3/uL (ref 150–400)
RBC: 5.3 MIL/uL (ref 4.22–5.81)
RDW: 13.7 % (ref 11.5–15.5)
WBC: 7.4 10*3/uL (ref 4.0–10.5)

## 2017-08-05 MED ORDER — SODIUM CHLORIDE 0.9 % IV SOLN
INTRAVENOUS | Status: AC | PRN
  Administered 2017-08-05: 10 mL/h via INTRAVENOUS

## 2017-08-05 MED ORDER — SODIUM CHLORIDE 0.9 % IV SOLN: INTRAVENOUS | Status: DC

## 2017-08-05 MED ORDER — LIDOCAINE HCL (PF) 1 % IJ SOLN
INTRAMUSCULAR | Status: AC
  Filled 2017-08-05: qty 30

## 2017-08-05 MED ORDER — MIDAZOLAM HCL 2 MG/2ML IJ SOLN
INTRAMUSCULAR | Status: AC
  Filled 2017-08-05: qty 2

## 2017-08-05 MED ORDER — FENTANYL CITRATE (PF) 100 MCG/2ML IJ SOLN
INTRAMUSCULAR | Status: AC | PRN
  Administered 2017-08-05 (×2): 25 ug via INTRAVENOUS
  Administered 2017-08-05: 50 ug via INTRAVENOUS
  Administered 2017-08-05: 25 ug via INTRAVENOUS

## 2017-08-05 MED ORDER — FENTANYL CITRATE (PF) 100 MCG/2ML IJ SOLN
INTRAMUSCULAR | Status: AC
  Filled 2017-08-05: qty 2

## 2017-08-05 MED ORDER — MIDAZOLAM HCL 2 MG/2ML IJ SOLN
INTRAMUSCULAR | Status: AC | PRN
  Administered 2017-08-05 (×3): 1 mg via INTRAVENOUS

## 2017-08-05 NOTE — Procedures (Signed)
Interventional Radiology Procedure Note  Procedure: US guided left parotid mass biopsy.  Mx 18g core bx.  Complications: None Recommendations:  - Ok to shower tomorrow - Do not submerge for 7 days - Routine wound care   Signed,  Dulcy Fanny. Earleen Newport, DO

## 2017-08-05 NOTE — Discharge Instructions (Addendum)
Needle Biopsy, Care After °Refer to this sheet in the next few weeks. These instructions provide you with information about caring for yourself after your procedure. Your health care provider may also give you more specific instructions. Your treatment has been planned according to current medical practices, but problems sometimes occur. Call your health care provider if you have any problems or questions after your procedure. °What can I expect after the procedure? °After your procedure, it is common to have soreness, bruising, or mild pain at the biopsy site. This should go away in a few days. °Follow these instructions at home: °· Rest as directed by your health care provider. °· Take medicines only as directed by your health care provider. °· There are many different ways to close and cover the biopsy site, including stitches (sutures), skin glue, and adhesive strips. Follow your health care provider's instructions about: °? Biopsy site care. °? Bandage (dressing) changes and removal. °? Biopsy site closure removal. °· Check your biopsy site every day for signs of infection. Watch for: °? Redness, swelling, or pain. °? Fluid, blood, or pus. °Contact a health care provider if: °· You have a fever. °· You have redness, swelling, or pain at the biopsy site that lasts longer than a few days. °· You have fluid, blood, or pus coming from the biopsy site. °· You feel nauseous. °· You vomit. °Get help right away if: °· You have shortness of breath. °· You have trouble breathing. °· You have chest pain. °· You feel dizzy or you faint. °· You have bleeding that does not stop with pressure or a bandage. °· You cough up blood. °· You have pain in your abdomen. °This information is not intended to replace advice given to you by your health care provider. Make sure you discuss any questions you have with your health care provider. °Document Released: 03/15/2015 Document Revised: 04/05/2016 Document Reviewed:  10/25/2014 °Elsevier Interactive Patient Education © 2018 Elsevier Inc[employer] °Moderate Conscious Sedation, Adult, Care After °These instructions provide you with information about caring for yourself after your procedure. Your health care provider may also give you more specific instructions. Your treatment has been planned according to current medical practices, but problems sometimes occur. Call your health care provider if you have any problems or questions after your procedure. °What can I expect after the procedure? °After your procedure, it is common: °· To feel sleepy for several hours. °· To feel clumsy and have poor balance for several hours. °· To have poor judgment for several hours. °· To vomit if you eat too soon. ° °Follow these instructions at home: °For at least 24 hours after the procedure: ° °· Do not: °? Participate in activities where you could fall or become injured. °? Drive. °? Use heavy machinery. °? Drink alcohol. °? Take sleeping pills or medicines that cause drowsiness. °? Make important decisions or sign legal documents. °? Take care of children on your own. °· Rest. °Eating and drinking °· Follow the diet recommended by your health care provider. °· If you vomit: °? Drink water, juice, or soup when you can drink without vomiting. °? Make sure you have little or no nausea before eating solid foods. °General instructions °· Have a responsible adult stay with you until you are awake and alert. °· Take over-the-counter and prescription medicines only as told by your health care provider. °· If you smoke, do not smoke without supervision. °· Keep all follow-up visits as told by your health care   provider. This is important. °Contact a health care provider if: °· You keep feeling nauseous or you keep vomiting. °· You feel light-headed. °· You develop a rash. °· You have a fever. °Get help right away if: °· You have trouble breathing. °This information is not intended to replace advice given to you  by your health care provider. Make sure you discuss any questions you have with your health care provider. °Document Released: 08/19/2013 Document Revised: 04/02/2016 Document Reviewed: 02/18/2016 °Elsevier Interactive Patient Education © 2018 Elsevier Inc. ° °

## 2017-08-05 NOTE — H&P (Signed)
Chief Complaint: left parotid mass  Referring Physician:Dr. Jerrell Belfast  Supervising Physician: Aletta Edouard  Patient Status: [facility] - Out-pt  HPI: Dakota Santos is a 56 y.o. male who had a CT scan back in February for diverticulitis that revealed an area in his lung that was concerning. This has been followed.  He has had multiple episodes of diverticulitis and had to undergo a colectomy.  He has then been worked up for this area in his lung.  He underwent a PET scan for which did not show hypermetabolic activity of the lung nodule, but did not rule out carcinoma.  He was found to have a 5.9FM hypermetabolic left parotid gland nodule that was suspicious.  He was referred to Dr. Wilburn Cornelia who has requested a biopsy of this lesion.  Past Medical History:  Past Medical History:  Diagnosis Date  . Diverticulitis   . Hyperlipidemia   . Hypertension     Past Surgical History:  Past Surgical History:  Procedure Laterality Date  . HERNIA REPAIR    . LAPAROSCOPIC SIGMOID COLECTOMY N/A 05/17/2017   Procedure: LAPAROSCOPIC ASSISTED SIGMOID COLECTOMY;  Surgeon: Coralie Keens, MD;  Location: [place];  Service: General;  Laterality: N/A;  . ORIF ELBOW FRACTURE Left 09/18/2014   Procedure: OPEN REDUCTION INTERNAL FIXATION (ORIF) ELBOW/OLECRANON FRACTURE LEFT;  Surgeon: Nita Sells, MD;  Location: [place];  Service: Orthopedics;  Laterality: Left;    Family History:  Family History  Problem Relation Age of Onset  . Atrial fibrillation Mother   . Prostate cancer Father     Social History:  reports that he quit smoking about 5 months ago. His smoking use included Cigarettes. He has a 20.00 pack-year smoking history. His smokeless tobacco use includes Snuff. He reports that he drinks alcohol. He reports that he does not use drugs.  Allergies:  Allergies  Allergen Reactions  . Penicillins     rash  . Simvastatin Other (See Comments)    myalgias     Medications: Medications reviewed in epic  Please HPI for pertinent positives, otherwise complete 10 system ROS negative.  Mallampati Score: MD Evaluation Airway: WNL Heart: WNL Abdomen: WNL Chest/ Lungs: WNL ASA  Classification: 2 Mallampati/Airway Score: One  Physical Exam: BP 123/86   Pulse (!) 58   Temp 97.8 F (36.6 C) (Oral)   Resp 16   Ht 5\' 10"  (1.778 m)   Wt 215 lb (97.5 kg)   SpO2 99%   BMI 30.85 kg/m  Body mass index is 30.85 kg/m. General: pleasant, WD, WN white male who is laying in bed in NAD HEENT: head is normocephalic, atraumatic.  Sclera are noninjected.  PERRL.  Ears and nose without any masses or lesions.  Mouth is pink and moist Heart: regular, rate, and rhythm.  Normal s1,s2. No obvious murmurs, gallops, or rubs noted.  Palpable radial and pedal pulses bilaterally Lungs: CTAB, no wheezes, rhonchi, or rales noted.  Respiratory effort nonlabored Abd: soft, NT, ND, +BS, no masses, hernias, or organomegaly Psych: A&Ox3 with an appropriate affect.   Labs: Results for orders placed or performed during the hospital encounter of 08/05/17 (from the past 48 hour(s))  APTT upon arrival     Status: None   Collection Time: 08/05/17  6:05 AM  Result Value Ref Range   aPTT 31 24 - 36 seconds  CBC upon arrival     Status: None   Collection Time: 08/05/17  6:05 AM  Result Value Ref Range  WBC 7.4 4.0 - 10.5 K/uL   RBC 5.30 4.22 - 5.81 MIL/uL   Hemoglobin 15.6 13.0 - 17.0 g/dL   HCT 46.1 39.0 - 52.0 %   MCV 87.0 78.0 - 100.0 fL   MCH 29.4 26.0 - 34.0 pg   MCHC 33.8 30.0 - 36.0 g/dL   RDW 13.7 11.5 - 15.5 %   Platelets 324 150 - 400 K/uL  Protime-INR upon arrival     Status: None   Collection Time: 08/05/17  6:05 AM  Result Value Ref Range   Prothrombin Time 12.9 11.4 - 15.2 seconds   INR 0.98     Imaging: No results found.  Assessment/Plan 1. Left parotid mass  We will plan to pursue parotid mass biopsy today.  His labs and vitals have been  reviewed.  Risks and benefits discussed with the patient including, but not limited to bleeding, infection, damage to adjacent structures or low yield requiring additional tests. All of the patient's questions were answered, patient is agreeable to proceed. Consent signed and in chart.   Thank you for this interesting consult.  I greatly enjoyed meeting LAMBERT JEANTY and look forward to participating in their care.  A copy of this report was sent to the requesting provider on this date.  Electronically Signed: Henreitta Cea 08/05/2017, 7:54 AM   I spent a total of  30 Minutes   in face to face in clinical consultation, greater than 50% of which was counseling/coordinating care for left parotid mass

## 2017-09-08 DIAGNOSIS — Z23 Encounter for immunization: Secondary | ICD-10-CM | POA: Diagnosis not present

## 2017-10-10 ENCOUNTER — Other Ambulatory Visit: Payer: Self-pay | Admitting: Cardiothoracic Surgery

## 2017-10-10 DIAGNOSIS — R911 Solitary pulmonary nodule: Secondary | ICD-10-CM

## 2017-10-31 ENCOUNTER — Other Ambulatory Visit: Payer: Self-pay | Admitting: Cardiothoracic Surgery

## 2017-11-01 DIAGNOSIS — I10 Essential (primary) hypertension: Secondary | ICD-10-CM | POA: Diagnosis not present

## 2017-11-01 DIAGNOSIS — Z23 Encounter for immunization: Secondary | ICD-10-CM | POA: Diagnosis not present

## 2017-11-01 DIAGNOSIS — Z0000 Encounter for general adult medical examination without abnormal findings: Secondary | ICD-10-CM | POA: Diagnosis not present

## 2017-11-01 DIAGNOSIS — E7800 Pure hypercholesterolemia, unspecified: Secondary | ICD-10-CM | POA: Diagnosis not present

## 2017-11-01 DIAGNOSIS — Z1159 Encounter for screening for other viral diseases: Secondary | ICD-10-CM | POA: Diagnosis not present

## 2017-11-01 DIAGNOSIS — Z125 Encounter for screening for malignant neoplasm of prostate: Secondary | ICD-10-CM | POA: Diagnosis not present

## 2017-11-01 DIAGNOSIS — K580 Irritable bowel syndrome with diarrhea: Secondary | ICD-10-CM | POA: Diagnosis not present

## 2017-11-07 ENCOUNTER — Other Ambulatory Visit: Payer: Self-pay | Admitting: *Deleted

## 2017-11-07 ENCOUNTER — Encounter: Payer: Self-pay | Admitting: Cardiothoracic Surgery

## 2017-11-07 ENCOUNTER — Ambulatory Visit (INDEPENDENT_AMBULATORY_CARE_PROVIDER_SITE_OTHER): Payer: 59 | Admitting: Cardiothoracic Surgery

## 2017-11-07 ENCOUNTER — Ambulatory Visit
Admission: RE | Admit: 2017-11-07 | Discharge: 2017-11-07 | Disposition: A | Discharge status: Home / Self Care | Payer: 59 | Source: Ambulatory Visit | Attending: Cardiothoracic Surgery | Admitting: Cardiothoracic Surgery

## 2017-11-07 ENCOUNTER — Other Ambulatory Visit: Payer: Self-pay

## 2017-11-07 VITALS — BP 127/90 | HR 64 | Resp 18 | Ht 70.0 in | Wt 228.0 lb

## 2017-11-07 DIAGNOSIS — R918 Other nonspecific abnormal finding of lung field: Secondary | ICD-10-CM

## 2017-11-07 DIAGNOSIS — R911 Solitary pulmonary nodule: Secondary | ICD-10-CM

## 2017-11-07 NOTE — Progress Notes (Signed)
Dakota Santos       [place] [address]             [phone]                    [place] Medical Record #[id number] Date of Birth: 1961-07-15  Referring: Kelton Pillar, MD Primary Care: Kelton Pillar, MD  Chief Complaint:   Left lung Mass  History of Present Illness:    Dakota Santos 56 y.o. male is seen in follow-up for a left lung mass noted incidentially on ct of abdomen done in Feb 2018 for abdominal pain. The patient had repeated episodes of abdominal pain since February. In July he underwent sigmoid colon resection for repeated episodes of diverticulitis.   Patient is long term smoker more then 40 years, he quit in April 2018.   Patient has previous dx of COPD, he had been prescribed Spiriva, but could not tolerate and stop using. He has no pulmonary symptoms at rest does note sob if climbs two flights of stairs  No previous cardiac history     Current Activity/ Functional Status:  Patient is independent with mobility/ambulation, transfers, ADL's, IADL's.   Zubrod Score: At the time of surgery this patient's most appropriate activity status/level should be described as: []     0    Normal activity, no symptoms [x]     1    Restricted in physical strenuous activity but ambulatory, able to do out light work []     2    Ambulatory and capable of self care, unable to do work activities, up and about               >50 % of waking hours                              []     3    Only limited self care, in bed greater than 50% of waking hours []     4    Completely disabled, no self care, confined to bed or chair []     5    Moribund   Past Medical History:  Diagnosis Date  . Diverticulitis   . Hyperlipidemia   . Hypertension     Past Surgical History:  Procedure Laterality Date  . HERNIA REPAIR    . LAPAROSCOPIC SIGMOID COLECTOMY N/A 05/17/2017   Procedure: LAPAROSCOPIC ASSISTED SIGMOID COLECTOMY;  Surgeon: Coralie Keens,  MD;  Location: [facility];  Service: General;  Laterality: N/A;  . ORIF ELBOW FRACTURE Left 09/18/2014   Procedure: OPEN REDUCTION INTERNAL FIXATION (ORIF) ELBOW/OLECRANON FRACTURE LEFT;  Surgeon: Nita Sells, MD;  Location: [facility];  Service: Orthopedics;  Laterality: Left;    Family History  Problem Relation Age of Onset  . Atrial fibrillation Mother   . Prostate cancer Father     Social History   Socioeconomic History  . Marital status: Married    Spouse name: Not on file  . Number of children: Not on file  . Years of education: Not on file  . Highest education level: Not on file  Social Needs  . Financial resource strain: Not on file  . Food insecurity - worry: Not on file  . Food insecurity - inability: Not on file  . Transportation needs - medical: Not on file  . Transportation needs - non-medical: Not on file  Occupational History  .  Occupation: Financial trader  Tobacco Use  . Smoking status: Former Smoker    Packs/day: 0.50    Years: 40.00    Pack years: 20.00    Types: Cigarettes    Last attempt to quit: 02/2017    Years since quitting: 0.7  . Smokeless tobacco: Current User    Types: Snuff  Substance and Sexual Activity  . Alcohol use: Yes    Comment: couple beers once a month  . Drug use: No  . Sexual activity: Not on file  Other Topics Concern  . Not on file  Social History Narrative  . Not on file    Social History   Tobacco Use  Smoking Status Former Smoker  . Packs/day: 0.50  . Years: 40.00  . Pack years: 20.00  . Types: Cigarettes  . Last attempt to quit: 02/2017  . Years since quitting: 0.7  Smokeless Tobacco Current User  . Types: Snuff    Social History   Substance and Sexual Activity  Alcohol Use Yes   Comment: couple beers once a month     Allergies  Allergen Reactions  . Penicillins     rash  . Simvastatin Other (See Comments)    myalgias    Current Outpatient Medications  Medication Sig Dispense Refill  . aspirin  EC 81 MG tablet Take 81 mg by mouth daily.    Marland Kitchen ezetimibe (ZETIA) 10 MG tablet Take 10 mg by mouth daily.    . fluticasone (FLONASE) 50 MCG/ACT nasal spray Place 1 spray into both nostrils daily as needed for allergies or rhinitis.    Marland Kitchen lisinopril-hydrochlorothiazide (PRINZIDE,ZESTORETIC) 20-25 MG tablet Take 0.5 tablets by mouth daily. (Patient taking differently: Take 1 tablet by mouth daily. )    . PROCTOZONE-HC 2.5 % rectal cream      No current facility-administered medications for this visit.     Pertinent items are noted in HPI.   Review of Systems:  Review of Systems  Constitutional: Negative.   HENT: Negative.   Eyes: Negative.   Respiratory: Negative.   Cardiovascular: Negative.   Gastrointestinal: Negative.   Genitourinary: Negative.   Musculoskeletal: Negative.   Skin: Negative.   Neurological: Negative.   Psychiatric/Behavioral: Negative.    Immunizations: Flu up to date Blue.Reese  ]; Pneumococcal up to date [n  ];   Physical Exam: BP 127/90 (BP Location: Right Arm, Patient Position: Sitting, Cuff Size: Large)   Pulse 64   Resp 18   Ht 5\' 10"  (1.778 m)   Wt 228 lb (103.4 kg)   SpO2 98% Comment: on RA  BMI 32.71 kg/m   PHYSICAL EXAMINATION: General appearance: alert and cooperative Head: Normocephalic, without obvious abnormality, atraumatic Neck: no adenopathy, no carotid bruit, no JVD, supple, symmetrical, trachea midline and thyroid not enlarged, symmetric, no tenderness/mass/nodules Lymph nodes: Cervical, supraclavicular, and axillary nodes normal. Resp: clear to auscultation bilaterally Back: symmetric, no curvature. ROM normal. No CVA tenderness. Cardio: regular rate and rhythm, S1, S2 normal, no murmur, click, rub or gallop GI: soft, non-tender; bowel sounds normal; no masses,  no organomegaly Extremities: extremities normal, atraumatic, no cyanosis or edema and Homans sign is negative, no sign of DVT Neurologic: Grossly normal Previous colectomy incisions  are healing well  Diagnostic Studies & Laboratory data:     Recent Radiology Findings:   Ct Super D Chest Wo Contrast  Result Date: 11/07/2017 CLINICAL DATA:  Lung nodule EXAM: CT CHEST WITHOUT CONTRAST TECHNIQUE: Multidetector CT imaging of the chest was performed  using thin slice collimation for electromagnetic bronchoscopy planning purposes, without intravenous contrast. COMPARISON:  07/03/2017 FINDINGS: Cardiovascular: The heart size is normal.  No pericardial effusion. Mediastinum/Nodes: No mediastinal lymphadenopathy. No evidence for gross hilar lymphadenopathy although assessment is limited by the lack of intravenous contrast on today's study. The esophagus has normal imaging features. There is no axillary lymphadenopathy. Lungs/Pleura: 7 x 11 mm left lower lobe sub solid nodule is unchanged. No other suspicious pulmonary nodule or mass. Upper Abdomen: Similar appearance of probable cyst upper pole left kidney. Musculoskeletal: Bone windows reveal no worrisome lytic or sclerotic osseous lesions. IMPRESSION: 1. Stable left lower lobe pulmonary nodule. No new or progressive findings. Electronically Signed   By: Misty Stanley M.D.   On: 11/07/2017 11:23   I have independently reviewed the above radiology studies  and reviewed the findings with the patient.   Ct Super D Chest Wo Contrast  Result Date: 07/03/2017 CLINICAL DATA:  Follow up pulmonary nodule. Previous colectomy. No history of malignancy. Former smoker. EXAM: CT CHEST WITHOUT CONTRAST TECHNIQUE: Multidetector CT imaging of the chest was performed using thin slice collimation for electromagnetic bronchoscopy planning purposes, without intravenous contrast. COMPARISON:  Chest CT 06/14/2017. PET-CT 06/25/2017. Abdominal CT 12/25/2016 and 02/28/2017. FINDINGS: Cardiovascular: No significant vascular findings on noncontrast imaging. The heart size is normal. There is no pericardial effusion. Mediastinum/Nodes: There are no enlarged  mediastinal, hilar or axillary lymph nodes. The thyroid gland, trachea and esophagus demonstrate no significant findings. Lungs/Pleura: There is no pleural effusion. The sub solid left lower lobe nodule is unchanged from the recent prior studies, measuring 11 x 7 mm on image 71. There are no new or enlarging pulmonary nodules. There is no endobronchial lesion. Upper abdomen: Borderline hepatic steatosis. No evidence of adrenal mass. Probable small cysts in the upper pole of the right kidney. Musculoskeletal/Chest wall: There is no chest wall mass or suspicious osseous finding. IMPRESSION: 1. Follow-up imaging for electro magnetic bronchoscopy planning. 2. The sub solid left lower lobe nodule is unchanged from the recent prior studies. 3. No acute findings. Electronically Signed   By: Richardean Sale M.D.   On: 07/03/2017 10:53     Ct Chest Wo Contrast  Result Date: 06/14/2017 CLINICAL DATA:  Follow-up nodule EXAM: CT CHEST WITHOUT CONTRAST TECHNIQUE: Multidetector CT imaging of the chest was performed following the standard protocol without IV contrast. COMPARISON:  02/28/2017 CT I FINDINGS: Cardiovascular: No evidence of aortic aneurysm or intramural hematoma. Mediastinum/Nodes: No abnormal mediastinal adenopathy. Visualized thyroid is unremarkable. Esophagus is unremarkable. No pericardial effusion. Lungs/Pleura: Left lower lobe mixed solid and ground-glass opacity on image 88 measures 1.4 cm and is stable dating back to February 13th of this year. The solid component measures 8 mm. No new nodule. Upper Abdomen: No acute abnormality. Musculoskeletal: No acute bony pathology. IMPRESSION: Persistence solid and sub solid left lower lobe nodule. The solid component is 8 mm. This nodule is highly suspicious for malignancy. Thoracic surgery consultation is recommended. Follow-up non-contrast CT recommended at 3-6 months to confirm persistence. If unchanged, and solid component remains <6 mm, annual CT is recommended  until 5 years of stability has been established. If persistent these nodules should be considered highly suspicious if the solid component of the nodule is 6 mm or greater in size and enlarging. This recommendation follows the consensus statement: Guidelines for Management of Incidental Pulmonary Nodules Detected on CT Images: From the Fleischner Society 2017; Radiology 2017; 284:228-243. None Electronically Signed   By: Marybelle Killings  M.D.   On: 06/14/2017 09:12   Nm Pet Image Initial (pi) Skull Base To Thigh  Result Date: 06/25/2017 CLINICAL DATA:  Initial treatment strategy for persistent subsolid left lower lobe pulmonary nodule. Status post laparoscopic sigmoid colectomy 05/17/2017 for recurrent sigmoid diverticulitis. EXAM: NUCLEAR MEDICINE PET SKULL BASE TO THIGH TECHNIQUE: 10.9 mCi F-18 FDG was injected intravenously. Full-ring PET imaging was performed from the skull base to thigh after the radiotracer. CT data was obtained and used for attenuation correction and anatomic localization. FASTING BLOOD GLUCOSE:  Value: 103 mg/dl COMPARISON:  06/14/2017 chest CT.  02/28/2017 CT abdomen/pelvis. FINDINGS: NECK: No hypermetabolic lymph nodes in the neck. Hypermetabolic 1.1 cm mass centered at the inferior margin of the deep lobe of the left parotid gland (series 4/image 26) with max SUV 7.4. Symmetric hypermetabolism in Waldeyer's ring without discrete mass correlate on the CT images, probably reactive. CHEST: Subsolid irregular 1.2 cm left lower lobe pulmonary nodule (series 8/image 40) demonstrates no significant metabolism (max SUV 0.7). No acute consolidative airspace disease, lung masses or additional significant pulmonary nodules. No enlarged or hypermetabolic axillary, mediastinal or hilar lymph nodes. No pneumothorax. No pleural effusions. ABDOMEN/PELVIS: No abnormal hypermetabolic activity within the liver, pancreas, adrenal glands, or spleen. No hypermetabolic lymph nodes in the abdomen or pelvis. Fat  stranding in the midline subcutaneous ventral abdominal wall with associated mild patchy hypermetabolism is compatible with expected postsurgical inflammatory change. No superficial fluid collections. Status post subtotal distal colectomy with intact appearing distal colonic anastomosis. Mild scattered diverticulosis throughout the remnant colon. Atherosclerotic nonaneurysmal abdominal aorta. Mild diffuse hepatic steatosis. Small simple renal cysts in both kidneys, largest 1.3 cm in the medial upper right kidney. Top-normal size prostate. SKELETON: No focal hypermetabolic activity to suggest skeletal metastasis. IMPRESSION: 1. Subsolid 1.2 cm left lower lobe pulmonary nodule demonstrates no significant metabolism. Differential continues to include low grade lung adenocarcinoma. Thoracic surgical consultation advised for consideration of surgical resection. Annual CT is recommended until 5 years of stability has been established if surgery is not elected. This recommendation follows the consensus statement: Guidelines for Management of Incidental Pulmonary Nodules Detected on CT Images: From the Fleischner Society 2017; Radiology 2017; 284:228-243. 2. Hypermetabolic 1.1 cm mass centered at the inferior margin of the deep lobe of the left parotid gland, suspicious for primary left parotid neoplasm, potentially malignant. Dedicated left parotid ultrasound with ultrasound-guided biopsy recommended. 3. Expected postsurgical changes in the midline ventral abdominal wall from interval subtotal distal colectomy. No fluid collections. Electronically Signed   By: Ilona Sorrel M.D.   On: 06/25/2017 12:05       Recent Lab Findings: Lab Results  Component Value Date   WBC 7.4 08/05/2017   HGB 15.6 08/05/2017   HCT 46.1 08/05/2017   PLT 324 08/05/2017   GLUCOSE 109 (H) 05/18/2017   ALT 42 02/28/2017   AST 39 02/28/2017   NA 135 05/18/2017   K 3.8 05/18/2017   CL 104 05/18/2017   CREATININE 1.00 05/18/2017   BUN  7 05/18/2017   CO2 22 05/18/2017   INR 0.98 08/05/2017   HGBA1C 6.0 (H) 05/08/2017   PFT's- reviewed done prior to last visit  FEV1 2.77 73% DLCO 26 81%  Conclusions: Minimal airway obstruction is present suggesting small airway disease. Pulmonary Function Diagnosis: Mild Obstructive Airways Disease Insignificant response to bronchodilator Normal Difusion    Assessment / Plan:   1/persistent left lower lobe groundglass opacity suggestive of low-grade lung cancer.  I discussed with the patient proceeding  with bronchoscopy with enb for biopsy.  I have explained to he and his wife the rate of false negative lesions in addition.  Risks and options were discussed in detail we will plan to go ahead January 8.  2/Hypermetabolic 1.1 cm mass centered at the inferior margin of the deep lobe of the left parotid gland -  Seen by ENT Wilburn Cornelia -benign    Grace Isaac MD      [address] [place] [address] Office [phone]   Beeper [phone]  11/07/2017 11:35 AM

## 2017-11-18 ENCOUNTER — Other Ambulatory Visit: Payer: Self-pay

## 2017-11-18 ENCOUNTER — Encounter (HOSPITAL_COMMUNITY): Payer: Self-pay | Admitting: *Deleted

## 2017-11-19 ENCOUNTER — Ambulatory Visit (HOSPITAL_COMMUNITY): Payer: 59

## 2017-11-19 ENCOUNTER — Encounter (HOSPITAL_COMMUNITY): Payer: Self-pay

## 2017-11-19 ENCOUNTER — Ambulatory Visit (HOSPITAL_COMMUNITY)
Admission: RE | Admit: 2017-11-19 | Discharge: 2017-11-19 | Disposition: A | Discharge status: Home / Self Care | Payer: 59 | Source: Ambulatory Visit | Attending: Cardiothoracic Surgery | Admitting: Cardiothoracic Surgery

## 2017-11-19 ENCOUNTER — Encounter (HOSPITAL_COMMUNITY)
Admission: RE | Disposition: A | Discharge status: Home / Self Care | Payer: Self-pay | Source: Ambulatory Visit | Attending: Cardiothoracic Surgery

## 2017-11-19 DIAGNOSIS — J449 Chronic obstructive pulmonary disease, unspecified: Secondary | ICD-10-CM | POA: Diagnosis not present

## 2017-11-19 DIAGNOSIS — E785 Hyperlipidemia, unspecified: Secondary | ICD-10-CM | POA: Insufficient documentation

## 2017-11-19 DIAGNOSIS — R918 Other nonspecific abnormal finding of lung field: Secondary | ICD-10-CM | POA: Insufficient documentation

## 2017-11-19 DIAGNOSIS — Z09 Encounter for follow-up examination after completed treatment for conditions other than malignant neoplasm: Secondary | ICD-10-CM

## 2017-11-19 DIAGNOSIS — Z419 Encounter for procedure for purposes other than remedying health state, unspecified: Secondary | ICD-10-CM

## 2017-11-19 DIAGNOSIS — Z8249 Family history of ischemic heart disease and other diseases of the circulatory system: Secondary | ICD-10-CM | POA: Insufficient documentation

## 2017-11-19 DIAGNOSIS — Z87891 Personal history of nicotine dependence: Secondary | ICD-10-CM | POA: Insufficient documentation

## 2017-11-19 DIAGNOSIS — Z880 Allergy status to penicillin: Secondary | ICD-10-CM | POA: Insufficient documentation

## 2017-11-19 DIAGNOSIS — K5732 Diverticulitis of large intestine without perforation or abscess without bleeding: Secondary | ICD-10-CM | POA: Insufficient documentation

## 2017-11-19 DIAGNOSIS — Z9049 Acquired absence of other specified parts of digestive tract: Secondary | ICD-10-CM | POA: Diagnosis not present

## 2017-11-19 DIAGNOSIS — Z8042 Family history of malignant neoplasm of prostate: Secondary | ICD-10-CM | POA: Diagnosis not present

## 2017-11-19 DIAGNOSIS — J9811 Atelectasis: Secondary | ICD-10-CM | POA: Diagnosis not present

## 2017-11-19 DIAGNOSIS — Z888 Allergy status to other drugs, medicaments and biological substances status: Secondary | ICD-10-CM | POA: Insufficient documentation

## 2017-11-19 DIAGNOSIS — I10 Essential (primary) hypertension: Secondary | ICD-10-CM | POA: Diagnosis not present

## 2017-11-19 DIAGNOSIS — K7689 Other specified diseases of liver: Secondary | ICD-10-CM | POA: Diagnosis not present

## 2017-11-19 DIAGNOSIS — R222 Localized swelling, mass and lump, trunk: Secondary | ICD-10-CM | POA: Diagnosis not present

## 2017-11-19 DIAGNOSIS — N281 Cyst of kidney, acquired: Secondary | ICD-10-CM | POA: Insufficient documentation

## 2017-11-19 DIAGNOSIS — R911 Solitary pulmonary nodule: Secondary | ICD-10-CM | POA: Diagnosis not present

## 2017-11-19 HISTORY — PX: LUNG BIOPSY: SHX5088

## 2017-11-19 HISTORY — PX: VIDEO BRONCHOSCOPY WITH ENDOBRONCHIAL NAVIGATION: SHX6175

## 2017-11-19 LAB — PROTIME-INR
INR: 0.97
Prothrombin Time: 12.8 seconds (ref 11.4–15.2)

## 2017-11-19 LAB — COMPREHENSIVE METABOLIC PANEL
ALT: 55 U/L (ref 17–63)
AST: 58 U/L — ABNORMAL HIGH (ref 15–41)
Albumin: 4.2 g/dL (ref 3.5–5.0)
Alkaline Phosphatase: 51 U/L (ref 38–126)
Anion gap: 11 (ref 5–15)
BUN: 11 mg/dL (ref 6–20)
CO2: 21 mmol/L — ABNORMAL LOW (ref 22–32)
Calcium: 9.4 mg/dL (ref 8.9–10.3)
Chloride: 104 mmol/L (ref 101–111)
Creatinine, Ser: 1.04 mg/dL (ref 0.61–1.24)
GFR calc Af Amer: >60 mL/min (ref >60)
GFR calc non Af Amer: >60 mL/min (ref >60)
Glucose, Bld: 96 mg/dL (ref 65–99)
Potassium: 5.4 mmol/L — ABNORMAL HIGH (ref 3.5–5.1)
Sodium: 136 mmol/L (ref 135–145)
Total Bilirubin: 1.5 mg/dL — ABNORMAL HIGH (ref 0.3–1.2)
Total Protein: 6.9 g/dL (ref 6.5–8.1)

## 2017-11-19 LAB — CBC
HCT: 46.6 % (ref 39.0–52.0)
Hemoglobin: 15.4 g/dL (ref 13.0–17.0)
MCH: 29.3 pg (ref 26.0–34.0)
MCHC: 33 g/dL (ref 30.0–36.0)
MCV: 88.8 fL (ref 78.0–100.0)
Platelets: 332 10*3/uL (ref 150–400)
RBC: 5.25 MIL/uL (ref 4.22–5.81)
RDW: 13.4 % (ref 11.5–15.5)
WBC: 6.8 10*3/uL (ref 4.0–10.5)

## 2017-11-19 LAB — APTT: aPTT: 30 seconds (ref 24–36)

## 2017-11-19 SURGERY — VIDEO BRONCHOSCOPY WITH ENDOBRONCHIAL NAVIGATION
Anesthesia: General | Site: Chest

## 2017-11-19 MED ORDER — FENTANYL CITRATE (PF) 100 MCG/2ML IJ SOLN
INTRAMUSCULAR | Status: DC | PRN
  Administered 2017-11-19: 100 ug via INTRAVENOUS
  Administered 2017-11-19: 25 ug via INTRAVENOUS
  Administered 2017-11-19: 50 ug via INTRAVENOUS

## 2017-11-19 MED ORDER — FENTANYL CITRATE (PF) 250 MCG/5ML IJ SOLN
INTRAMUSCULAR | Status: AC
  Filled 2017-11-19: qty 5

## 2017-11-19 MED ORDER — PROPOFOL 10 MG/ML IV BOLUS
INTRAVENOUS | Status: DC | PRN
  Administered 2017-11-19: 200 mg via INTRAVENOUS

## 2017-11-19 MED ORDER — LIDOCAINE 2% (20 MG/ML) 5 ML SYRINGE
INTRAMUSCULAR | Status: AC
  Filled 2017-11-19: qty 5

## 2017-11-19 MED ORDER — LACTATED RINGERS IV SOLN: INTRAVENOUS | Status: DC

## 2017-11-19 MED ORDER — EPINEPHRINE PF 1 MG/ML IJ SOLN
INTRAMUSCULAR | Status: AC
  Filled 2017-11-19: qty 1

## 2017-11-19 MED ORDER — MEPERIDINE HCL 25 MG/ML IJ SOLN: 6.2500 mg | INTRAMUSCULAR | Status: DC | PRN

## 2017-11-19 MED ORDER — ROCURONIUM BROMIDE 100 MG/10ML IV SOLN
INTRAVENOUS | Status: DC | PRN
  Administered 2017-11-19: 10 mg via INTRAVENOUS
  Administered 2017-11-19: 60 mg via INTRAVENOUS
  Administered 2017-11-19: 10 mg via INTRAVENOUS
  Administered 2017-11-19: 20 mg via INTRAVENOUS
  Administered 2017-11-19 (×2): 10 mg via INTRAVENOUS

## 2017-11-19 MED ORDER — HYDROMORPHONE HCL 1 MG/ML IJ SOLN: 0.2500 mg | INTRAMUSCULAR | Status: DC | PRN

## 2017-11-19 MED ORDER — PROMETHAZINE HCL 25 MG/ML IJ SOLN: 6.2500 mg | INTRAMUSCULAR | Status: DC | PRN

## 2017-11-19 MED ORDER — LACTATED RINGERS IV SOLN
INTRAVENOUS | Status: DC | PRN
  Administered 2017-11-19 (×2): via INTRAVENOUS

## 2017-11-19 MED ORDER — SUGAMMADEX SODIUM 200 MG/2ML IV SOLN
INTRAVENOUS | Status: AC
  Filled 2017-11-19: qty 2

## 2017-11-19 MED ORDER — MIDAZOLAM HCL 5 MG/5ML IJ SOLN
INTRAMUSCULAR | Status: DC | PRN
  Administered 2017-11-19: 2 mg via INTRAVENOUS

## 2017-11-19 MED ORDER — ONDANSETRON HCL 4 MG/2ML IJ SOLN
INTRAMUSCULAR | Status: DC | PRN
  Administered 2017-11-19: 4 mg via INTRAVENOUS

## 2017-11-19 MED ORDER — PROPOFOL 10 MG/ML IV BOLUS
INTRAVENOUS | Status: AC
  Filled 2017-11-19: qty 20

## 2017-11-19 MED ORDER — ROCURONIUM BROMIDE 10 MG/ML (PF) SYRINGE
PREFILLED_SYRINGE | INTRAVENOUS | Status: AC
  Filled 2017-11-19: qty 5

## 2017-11-19 MED ORDER — LIDOCAINE 2% (20 MG/ML) 5 ML SYRINGE
INTRAMUSCULAR | Status: DC | PRN
  Administered 2017-11-19: 100 mg via INTRAVENOUS

## 2017-11-19 MED ORDER — ONDANSETRON HCL 4 MG/2ML IJ SOLN
INTRAMUSCULAR | Status: AC
  Filled 2017-11-19: qty 2

## 2017-11-19 MED ORDER — MIDAZOLAM HCL 2 MG/2ML IJ SOLN
INTRAMUSCULAR | Status: AC
  Filled 2017-11-19: qty 2

## 2017-11-19 MED ORDER — SUGAMMADEX SODIUM 200 MG/2ML IV SOLN
INTRAVENOUS | Status: DC | PRN
  Administered 2017-11-19: 200 mg via INTRAVENOUS

## 2017-11-19 MED ORDER — LACTATED RINGERS IV SOLN
INTRAVENOUS | Status: DC
  Administered 2017-11-19: 10:00:00 via INTRAVENOUS

## 2017-11-19 MED ORDER — EPHEDRINE 5 MG/ML INJ
INTRAVENOUS | Status: AC
  Filled 2017-11-19: qty 10

## 2017-11-19 MED ORDER — PHENYLEPHRINE 40 MCG/ML (10ML) SYRINGE FOR IV PUSH (FOR BLOOD PRESSURE SUPPORT)
PREFILLED_SYRINGE | INTRAVENOUS | Status: AC
  Filled 2017-11-19: qty 10

## 2017-11-19 MED ORDER — DEXAMETHASONE SODIUM PHOSPHATE 10 MG/ML IJ SOLN
INTRAMUSCULAR | Status: AC
  Filled 2017-11-19: qty 1

## 2017-11-19 MED ORDER — 0.9 % SODIUM CHLORIDE (POUR BTL) OPTIME
TOPICAL | Status: DC | PRN
  Administered 2017-11-19: 1000 mL

## 2017-11-19 SURGICAL SUPPLY — 34 items
ADAPTER BRONCH F/PENTAX (ADAPTER) ×4 IMPLANT
BRUSH BIOPSY BRONCH 10 SDTNB (MISCELLANEOUS) ×2 IMPLANT
BRUSH SUPERTRAX BIOPSY (INSTRUMENTS) IMPLANT
BRUSH SUPERTRAX NDL-TIP CYTO (INSTRUMENTS) ×2 IMPLANT
CANISTER SUCT 3000ML PPV (MISCELLANEOUS) ×2 IMPLANT
CHANNEL WORK EXTEND EDGE 180 (KITS) IMPLANT
CHANNEL WORK EXTEND EDGE 45 (KITS) IMPLANT
CHANNEL WORK EXTEND EDGE 90 (KITS) IMPLANT
CONT SPEC 4OZ CLIKSEAL STRL BL (MISCELLANEOUS) ×4 IMPLANT
COVER BACK TABLE 60X90IN (DRAPES) ×2 IMPLANT
FILTER STRAW FLUID ASPIR (MISCELLANEOUS) IMPLANT
FORCEPS BIOP SUPERTRX PREMAR (INSTRUMENTS) ×2 IMPLANT
GAUZE SPONGE 4X4 12PLY STRL (GAUZE/BANDAGES/DRESSINGS) ×2 IMPLANT
GLOVE BIO SURGEON STRL SZ 6.5 (GLOVE) ×4 IMPLANT
KIT CLEAN ENDO COMPLIANCE (KITS) ×2 IMPLANT
KIT PROCEDURE EDGE 180 (KITS) IMPLANT
KIT PROCEDURE EDGE 45 (KITS) IMPLANT
KIT PROCEDURE EDGE 90 (KITS) ×2 IMPLANT
KIT ROOM TURNOVER OR (KITS) ×2 IMPLANT
MARKER SKIN DUAL TIP RULER LAB (MISCELLANEOUS) ×2 IMPLANT
NEEDLE SUPERTRX PREMARK BIOPSY (NEEDLE) ×2 IMPLANT
NS IRRIG 1000ML POUR BTL (IV SOLUTION) ×2 IMPLANT
OIL SILICONE PENTAX (PARTS (SERVICE/REPAIRS)) ×2 IMPLANT
PAD ARMBOARD 7.5X6 YLW CONV (MISCELLANEOUS) ×4 IMPLANT
PATCHES PATIENT (LABEL) ×6 IMPLANT
SYR 20CC LL (SYRINGE) IMPLANT
SYR 20ML ECCENTRIC (SYRINGE) ×2 IMPLANT
SYR 3ML LL SCALE MARK (SYRINGE) ×2 IMPLANT
TOWEL GREEN STERILE (TOWEL DISPOSABLE) ×2 IMPLANT
TOWEL GREEN STERILE FF (TOWEL DISPOSABLE) ×2 IMPLANT
TRAP SPECIMEN MUCOUS 40CC (MISCELLANEOUS) ×2 IMPLANT
TUBE CONNECTING 20X1/4 (TUBING) ×2 IMPLANT
UNDERPAD 30X30 (UNDERPADS AND DIAPERS) ×2 IMPLANT
WATER STERILE IRR 1000ML POUR (IV SOLUTION) ×2 IMPLANT

## 2017-11-19 NOTE — H&P (Signed)
[address]       [place] [address]             [phone]                    Dakota Santos [facility] Medical Record #[id number] Date of Birth: 07-17-1961  Referring: No ref. provider found Primary Care: Kelton Pillar, MD  Chief Complaint:   Left lung Mass  History of Present Illness:    Dakota Santos 57 y.o. male is seen in follow-up for a left lung mass noted incidentially on ct of abdomen done in Feb 2018 for abdominal pain. The patient had repeated episodes of abdominal pain since February. In July he underwent sigmoid colon resection for repeated episodes of diverticulitis.   Patient is long term smoker more then 40 years, he quit in April 2018.   Patient has previous dx of COPD, he had been prescribed Spiriva, but could not tolerate and stop using. He has no pulmonary symptoms at rest does note sob if climbs two flights of stairs  No previous cardiac history     Current Activity/ Functional Status:  Patient is independent with mobility/ambulation, transfers, ADL's, IADL's.   Zubrod Score: At the time of surgery this patient's most appropriate activity status/level should be described as: []     0    Normal activity, no symptoms [x]     1    Restricted in physical strenuous activity but ambulatory, able to do out light work []     2    Ambulatory and capable of self care, unable to do work activities, up and about               >50 % of waking hours                              []     3    Only limited self care, in bed greater than 50% of waking hours []     4    Completely disabled, no self care, confined to bed or chair []     5    Moribund   Past Medical History:  Diagnosis Date  . Diverticulitis   . Hyperlipidemia   . Hypertension     Past Surgical History:  Procedure Laterality Date  . COLONOSCOPY    . COLONOSCOPY    . HERNIA REPAIR      abdomen  . LAPAROSCOPIC SIGMOID COLECTOMY N/A 05/17/2017   Procedure: LAPAROSCOPIC  ASSISTED SIGMOID COLECTOMY;  Surgeon: Coralie Keens, MD;  Location: [facility];  Service: General;  Laterality: N/A;  . LASIK    . ORIF ELBOW FRACTURE Left 09/18/2014   Procedure: OPEN REDUCTION INTERNAL FIXATION (ORIF) ELBOW/OLECRANON FRACTURE LEFT;  Surgeon: Nita Sells, MD;  Location: [facility];  Service: Orthopedics;  Laterality: Left;  . UPPER GASTROINTESTINAL ENDOSCOPY      Family History  Problem Relation Age of Onset  . Atrial fibrillation Mother   . Prostate cancer Father     Social History   Socioeconomic History  . Marital status: Married    Spouse name: Not on file  . Number of children: Not on file  . Years of education: Not on file  . Highest education level: Not on file  Social Needs  . Financial resource strain: Not on file  . Food insecurity - worry: Not on file  . Food insecurity -  inability: Not on file  . Transportation needs - medical: Not on file  . Transportation needs - non-medical: Not on file  Occupational History  . Occupation: Financial trader  Tobacco Use  . Smoking status: Former Smoker    Packs/day: 0.50    Years: 40.00    Pack years: 20.00    Types: Cigarettes    Last attempt to quit: 02/2017    Years since quitting: 0.7  . Smokeless tobacco: Current User    Types: Snuff  Substance and Sexual Activity  . Alcohol use: Yes    Comment: 1 drink a  -week- Beer, wine, or liquor  . Drug use: No  . Sexual activity: Not on file  Other Topics Concern  . Not on file  Social History Narrative  . Not on file    Social History   Tobacco Use  Smoking Status Former Smoker  . Packs/day: 0.50  . Years: 40.00  . Pack years: 20.00  . Types: Cigarettes  . Last attempt to quit: 02/2017  . Years since quitting: 0.7  Smokeless Tobacco Current User  . Types: Snuff    Social History   Substance and Sexual Activity  Alcohol Use Yes   Comment: 1 drink a  -week- Beer, wine, or liquor     Allergies  Allergen Reactions  . Simvastatin Other  (See Comments)    myalgias  . Penicillins Rash    Has patient had a PCN reaction causing immediate rash, facial/tongue/throat swelling, SOB or lightheadedness with hypotension: Unknown Has patient had a PCN reaction causing severe rash involving mucus membranes or skin necrosis: Unknown Has patient had a PCN reaction that required hospitalization: Unknown Has patient had a PCN reaction occurring within the last 10 years: Yes If all of the above answers are "NO", then may proceed with Cephalosporin use.     Current Facility-Administered Medications  Medication Dose Route Frequency Provider Last Rate Last Dose  . lactated ringers infusion   Intravenous Continuous Effie Berkshire, MD 10 mL/hr at 11/19/17 [phone]      Pertinent items are noted in HPI.   Review of Systems:  Review of Systems  Constitutional: Negative.   HENT: Negative.   Eyes: Negative.   Respiratory: Negative.   Cardiovascular: Negative.   Gastrointestinal: Negative.   Genitourinary: Negative.   Musculoskeletal: Negative.   Skin: Negative.   Neurological: Negative.   Psychiatric/Behavioral: Negative.    Immunizations: Flu up to date Blue.Reese  ]; Pneumococcal up to date [n  ];   Physical Exam: BP 133/78   Pulse (!) 54   Temp 97.9 F (36.6 C)   Resp 19   Ht 5\' 10"  (1.778 m)   Wt 228 lb (103.4 kg)   SpO2 99%   BMI 32.71 kg/m   PHYSICAL EXAMINATION: General appearance: alert and cooperative Head: Normocephalic, without obvious abnormality, atraumatic Neck: no adenopathy, no carotid bruit, no JVD, supple, symmetrical, trachea midline and thyroid not enlarged, symmetric, no tenderness/mass/nodules Lymph nodes: Cervical, supraclavicular, and axillary nodes normal. Resp: clear to auscultation bilaterally Back: symmetric, no curvature. ROM normal. No CVA tenderness. Cardio: regular rate and rhythm, S1, S2 normal, no murmur, click, rub or gallop GI: soft, non-tender; bowel sounds normal; no masses,  no  organomegaly Extremities: extremities normal, atraumatic, no cyanosis or edema and Homans sign is negative, no sign of DVT Neurologic: Grossly normal Previous colectomy incisions are healing well  Diagnostic Studies & Laboratory data:     Recent Radiology Findings:   Dg  Chest 2 View  Result Date: 11/19/2017 CLINICAL DATA:  Left lung mass. EXAM: CHEST  2 VIEW COMPARISON:  Chest x-ray dated 02/28/2017 and chest CT dated 11/07/2017 FINDINGS: Heart size and pulmonary vascularity are normal. The lungs appear clear. The lung mass seen on CT scan is not apparent on this chest x-ray. No effusions. Bones appear normal. IMPRESSION: Normal chest. Electronically Signed   By: Lorriane Shire M.D.   On: 11/19/2017 09:29   Ct Super D Chest Wo Contrast  Result Date: 11/07/2017 CLINICAL DATA:  Lung nodule EXAM: CT CHEST WITHOUT CONTRAST TECHNIQUE: Multidetector CT imaging of the chest was performed using thin slice collimation for electromagnetic bronchoscopy planning purposes, without intravenous contrast. COMPARISON:  07/03/2017 FINDINGS: Cardiovascular: The heart size is normal.  No pericardial effusion. Mediastinum/Nodes: No mediastinal lymphadenopathy. No evidence for gross hilar lymphadenopathy although assessment is limited by the lack of intravenous contrast on today's study. The esophagus has normal imaging features. There is no axillary lymphadenopathy. Lungs/Pleura: 7 x 11 mm left lower lobe sub solid nodule is unchanged. No other suspicious pulmonary nodule or mass. Upper Abdomen: Similar appearance of probable cyst upper pole left kidney. Musculoskeletal: Bone windows reveal no worrisome lytic or sclerotic osseous lesions. IMPRESSION: 1. Stable left lower lobe pulmonary nodule. No new or progressive findings. Electronically Signed   By: Misty Stanley M.D.   On: 11/07/2017 11:23   I have independently reviewed the above radiology studies  and reviewed the findings with the patient.   Ct Super D Chest  Wo Contrast  Result Date: 07/03/2017 CLINICAL DATA:  Follow up pulmonary nodule. Previous colectomy. No history of malignancy. Former smoker. EXAM: CT CHEST WITHOUT CONTRAST TECHNIQUE: Multidetector CT imaging of the chest was performed using thin slice collimation for electromagnetic bronchoscopy planning purposes, without intravenous contrast. COMPARISON:  Chest CT 06/14/2017. PET-CT 06/25/2017. Abdominal CT 12/25/2016 and 02/28/2017. FINDINGS: Cardiovascular: No significant vascular findings on noncontrast imaging. The heart size is normal. There is no pericardial effusion. Mediastinum/Nodes: There are no enlarged mediastinal, hilar or axillary lymph nodes. The thyroid gland, trachea and esophagus demonstrate no significant findings. Lungs/Pleura: There is no pleural effusion. The sub solid left lower lobe nodule is unchanged from the recent prior studies, measuring 11 x 7 mm on image 71. There are no new or enlarging pulmonary nodules. There is no endobronchial lesion. Upper abdomen: Borderline hepatic steatosis. No evidence of adrenal mass. Probable small cysts in the upper pole of the right kidney. Musculoskeletal/Chest wall: There is no chest wall mass or suspicious osseous finding. IMPRESSION: 1. Follow-up imaging for electro magnetic bronchoscopy planning. 2. The sub solid left lower lobe nodule is unchanged from the recent prior studies. 3. No acute findings. Electronically Signed   By: Richardean Sale M.D.   On: 07/03/2017 10:53     Ct Chest Wo Contrast  Result Date: 06/14/2017 CLINICAL DATA:  Follow-up nodule EXAM: CT CHEST WITHOUT CONTRAST TECHNIQUE: Multidetector CT imaging of the chest was performed following the standard protocol without IV contrast. COMPARISON:  02/28/2017 CT I FINDINGS: Cardiovascular: No evidence of aortic aneurysm or intramural hematoma. Mediastinum/Nodes: No abnormal mediastinal adenopathy. Visualized thyroid is unremarkable. Esophagus is unremarkable. No pericardial  effusion. Lungs/Pleura: Left lower lobe mixed solid and ground-glass opacity on image 88 measures 1.4 cm and is stable dating back to February 13th of this year. The solid component measures 8 mm. No new nodule. Upper Abdomen: No acute abnormality. Musculoskeletal: No acute bony pathology. IMPRESSION: Persistence solid and sub solid left lower lobe  nodule. The solid component is 8 mm. This nodule is highly suspicious for malignancy. Thoracic surgery consultation is recommended. Follow-up non-contrast CT recommended at 3-6 months to confirm persistence. If unchanged, and solid component remains <6 mm, annual CT is recommended until 5 years of stability has been established. If persistent these nodules should be considered highly suspicious if the solid component of the nodule is 6 mm or greater in size and enlarging. This recommendation follows the consensus statement: Guidelines for Management of Incidental Pulmonary Nodules Detected on CT Images: From the Fleischner Society 2017; Radiology 2017; 284:228-243. None Electronically Signed   By: Marybelle Killings M.D.   On: 06/14/2017 09:12   Nm Pet Image Initial (pi) Skull Base To Thigh  Result Date: 06/25/2017 CLINICAL DATA:  Initial treatment strategy for persistent subsolid left lower lobe pulmonary nodule. Status post laparoscopic sigmoid colectomy 05/17/2017 for recurrent sigmoid diverticulitis. EXAM: NUCLEAR MEDICINE PET SKULL BASE TO THIGH TECHNIQUE: 10.9 mCi F-18 FDG was injected intravenously. Full-ring PET imaging was performed from the skull base to thigh after the radiotracer. CT data was obtained and used for attenuation correction and anatomic localization. FASTING BLOOD GLUCOSE:  Value: 103 mg/dl COMPARISON:  06/14/2017 chest CT.  02/28/2017 CT abdomen/pelvis. FINDINGS: NECK: No hypermetabolic lymph nodes in the neck. Hypermetabolic 1.1 cm mass centered at the inferior margin of the deep lobe of the left parotid gland (series 4/image 26) with max SUV 7.4.  Symmetric hypermetabolism in Waldeyer's ring without discrete mass correlate on the CT images, probably reactive. CHEST: Subsolid irregular 1.2 cm left lower lobe pulmonary nodule (series 8/image 40) demonstrates no significant metabolism (max SUV 0.7). No acute consolidative airspace disease, lung masses or additional significant pulmonary nodules. No enlarged or hypermetabolic axillary, mediastinal or hilar lymph nodes. No pneumothorax. No pleural effusions. ABDOMEN/PELVIS: No abnormal hypermetabolic activity within the liver, pancreas, adrenal glands, or spleen. No hypermetabolic lymph nodes in the abdomen or pelvis. Fat stranding in the midline subcutaneous ventral abdominal wall with associated mild patchy hypermetabolism is compatible with expected postsurgical inflammatory change. No superficial fluid collections. Status post subtotal distal colectomy with intact appearing distal colonic anastomosis. Mild scattered diverticulosis throughout the remnant colon. Atherosclerotic nonaneurysmal abdominal aorta. Mild diffuse hepatic steatosis. Small simple renal cysts in both kidneys, largest 1.3 cm in the medial upper right kidney. Top-normal size prostate. SKELETON: No focal hypermetabolic activity to suggest skeletal metastasis. IMPRESSION: 1. Subsolid 1.2 cm left lower lobe pulmonary nodule demonstrates no significant metabolism. Differential continues to include low grade lung adenocarcinoma. Thoracic surgical consultation advised for consideration of surgical resection. Annual CT is recommended until 5 years of stability has been established if surgery is not elected. This recommendation follows the consensus statement: Guidelines for Management of Incidental Pulmonary Nodules Detected on CT Images: From the Fleischner Society 2017; Radiology 2017; 284:228-243. 2. Hypermetabolic 1.1 cm mass centered at the inferior margin of the deep lobe of the left parotid gland, suspicious for primary left parotid neoplasm,  potentially malignant. Dedicated left parotid ultrasound with ultrasound-guided biopsy recommended. 3. Expected postsurgical changes in the midline ventral abdominal wall from interval subtotal distal colectomy. No fluid collections. Electronically Signed   By: Ilona Sorrel M.D.   On: 06/25/2017 12:05       Recent Lab Findings: Lab Results  Component Value Date   WBC 6.8 11/19/2017   HGB 15.4 11/19/2017   HCT 46.6 11/19/2017   PLT 332 11/19/2017   GLUCOSE 96 11/19/2017   ALT 55 11/19/2017   AST 58 (H)  11/19/2017   NA 136 11/19/2017   K 5.4 (H) 11/19/2017   CL 104 11/19/2017   CREATININE 1.04 11/19/2017   BUN 11 11/19/2017   CO2 21 (L) 11/19/2017   INR 0.97 11/19/2017   HGBA1C 6.0 (H) 05/08/2017   PFT's- reviewed done prior to last visit  FEV1 2.77 73% DLCO 26 81%  Conclusions: Minimal airway obstruction is present suggesting small airway disease. Pulmonary Function Diagnosis: Mild Obstructive Airways Disease Insignificant response to bronchodilator Normal Difusion    Assessment / Plan:   1/persistent left lower lobe groundglass opacity suggestive of low-grade lung cancer.  I discussed with the patient proceeding with bronchoscopy with enb for biopsy.  I have explained to he and his wife the rate of false negative lesions in addition.  Risks and options were discussed in detail  The goals risks and alternatives of the planned surgical procedure Procedure(s): [place] (N/A) LUNG BIOPSY (N/A)  have been discussed with the patient in detail. The risks of the procedure including death, infection, pneumothorax stroke, myocardial infarction, bleeding, blood transfusion have all been discussed specifically.  I have quoted Donnella Sham a 1 % of perioperative mortality and a complication rate as high as 30 %. The patient's questions have been answered.Jorah Delano Metz is willing  to proceed with the planned procedure.  2/Hypermetabolic 1.1 cm  mass centered at the inferior margin of the deep lobe of the left parotid gland -  Seen by ENT Wilburn Cornelia -benign    Grace Isaac MD      [address] [place] [address] Office [phone]   Beeper [phone]  11/19/2017 10:49 AM

## 2017-11-19 NOTE — Brief Op Note (Signed)
      [address]       [place] [address]             [phone]      11/19/2017  1:46 PM  PATIENT:  Dakota Santos  57 y.o. male  PRE-OPERATIVE DIAGNOSIS:  LUNG NODULE left lower lobe   POST-OPERATIVE DIAGNOSIS:  Same   PROCEDURE:  Procedure(s): VIDEO BRONCHOSCOPY WITH ENDOBRONCHIAL NAVIGATION (N/A) LUNG BIOPSY (N/A)  SURGEON:  Surgeon(s) and Role:    * Grace Isaac, MD - Primary   ANESTHESIA:   general  EBL:  0 mL   BLOOD ADMINISTERED:none  DRAINS: none   LOCAL MEDICATIONS USED:  NONE  SPECIMEN:  Source of Specimen:  left lower lobe  DISPOSITION OF SPECIMEN:  PATHOLOGY  COUNTS:  YES  TOURNIQUET:  * No tourniquets in log *  DICTATION: .Dragon Dictation  PLAN OF CARE: Discharge to home after PACU  PATIENT DISPOSITION:  PACU - hemodynamically stable.   Delay start of Pharmacological VTE agent (>24hrs) due to surgical blood loss or risk of bleeding: yes

## 2017-11-19 NOTE — Progress Notes (Signed)
[address]       [place], [address]             [phone]                    Dornell M Danese [facility] Medical Record #[id number] Date of Birth: February 08, 1961  Referring: No ref. provider found Primary Care: Kelton Pillar, MD  Chief Complaint:   Left lung Mass  History of Present Illness:    Dakota Santos 57 y.o. male is seen in follow-up for a left lung mass noted incidentially on ct of abdomen done in Feb 2018 for abdominal pain. The patient had repeated episodes of abdominal pain since February. In July he underwent sigmoid colon resection for repeated episodes of diverticulitis.   Patient is long term smoker more then 40 years, he quit in April 2018.   Patient has previous dx of COPD, he had been prescribed Spiriva, but could not tolerate and stop using. He has no pulmonary symptoms at rest does note sob if climbs two flights of stairs  No previous cardiac history     Current Activity/ Functional Status:  Patient is independent with mobility/ambulation, transfers, ADL's, IADL's.   Zubrod Score: At the time of surgery this patient's most appropriate activity status/level should be described as: []     0    Normal activity, no symptoms [x]     1    Restricted in physical strenuous activity but ambulatory, able to do out light work []     2    Ambulatory and capable of self care, unable to do work activities, up and about               >50 % of waking hours                              []     3    Only limited self care, in bed greater than 50% of waking hours []     4    Completely disabled, no self care, confined to bed or chair []     5    Moribund   Past Medical History:  Diagnosis Date  . Diverticulitis   . Hyperlipidemia   . Hypertension     Past Surgical History:  Procedure Laterality Date  . COLONOSCOPY    . COLONOSCOPY    . HERNIA REPAIR      abdomen  . LAPAROSCOPIC SIGMOID COLECTOMY N/A 05/17/2017   Procedure: LAPAROSCOPIC  ASSISTED SIGMOID COLECTOMY;  Surgeon: Coralie Keens, MD;  Location: [facility];  Service: General;  Laterality: N/A;  . LASIK    . ORIF ELBOW FRACTURE Left 09/18/2014   Procedure: OPEN REDUCTION INTERNAL FIXATION (ORIF) ELBOW/OLECRANON FRACTURE LEFT;  Surgeon: Nita Sells, MD;  Location: [facility];  Service: Orthopedics;  Laterality: Left;  . UPPER GASTROINTESTINAL ENDOSCOPY      Family History  Problem Relation Age of Onset  . Atrial fibrillation Mother   . Prostate cancer Father     Social History   Socioeconomic History  . Marital status: Married    Spouse name: Not on file  . Number of children: Not on file  . Years of education: Not on file  . Highest education level: Not on file  Social Needs  . Financial resource strain: Not on file  . Food insecurity - worry: Not on file  . Food insecurity -  inability: Not on file  . Transportation needs - medical: Not on file  . Transportation needs - non-medical: Not on file  Occupational History  . Occupation: Financial trader  Tobacco Use  . Smoking status: Former Smoker    Packs/day: 0.50    Years: 40.00    Pack years: 20.00    Types: Cigarettes    Last attempt to quit: 02/2017    Years since quitting: 0.7  . Smokeless tobacco: Current User    Types: Snuff  Substance and Sexual Activity  . Alcohol use: Yes    Comment: 1 drink a  -week- Beer, wine, or liquor  . Drug use: No  . Sexual activity: Not on file  Other Topics Concern  . Not on file  Social History Narrative  . Not on file    Social History   Tobacco Use  Smoking Status Former Smoker  . Packs/day: 0.50  . Years: 40.00  . Pack years: 20.00  . Types: Cigarettes  . Last attempt to quit: 02/2017  . Years since quitting: 0.7  Smokeless Tobacco Current User  . Types: Snuff    Social History   Substance and Sexual Activity  Alcohol Use Yes   Comment: 1 drink a  -week- Beer, wine, or liquor     Allergies  Allergen Reactions  . Simvastatin Other  (See Comments)    myalgias  . Penicillins Rash    Has patient had a PCN reaction causing immediate rash, facial/tongue/throat swelling, SOB or lightheadedness with hypotension: Unknown Has patient had a PCN reaction causing severe rash involving mucus membranes or skin necrosis: Unknown Has patient had a PCN reaction that required hospitalization: Unknown Has patient had a PCN reaction occurring within the last 10 years: Yes If all of the above answers are "NO", then may proceed with Cephalosporin use.     Current Facility-Administered Medications  Medication Dose Route Frequency Provider Last Rate Last Dose  . lactated ringers infusion   Intravenous Continuous Effie Berkshire, MD 10 mL/hr at 11/19/17 [phone]      Pertinent items are noted in HPI.   Review of Systems:  Review of Systems  Constitutional: Negative.   HENT: Negative.   Eyes: Negative.   Respiratory: Negative.   Cardiovascular: Negative.   Gastrointestinal: Negative.   Genitourinary: Negative.   Musculoskeletal: Negative.   Skin: Negative.   Neurological: Negative.   Psychiatric/Behavioral: Negative.    Immunizations: Flu up to date Blue.Reese  ]; Pneumococcal up to date [n  ];   Physical Exam: BP 133/78   Pulse (!) 54   Temp 97.9 F (36.6 C)   Resp 19   Ht 5\' 10"  (1.778 m)   Wt 228 lb (103.4 kg)   SpO2 99%   BMI 32.71 kg/m   PHYSICAL EXAMINATION: General appearance: alert and cooperative Head: Normocephalic, without obvious abnormality, atraumatic Neck: no adenopathy, no carotid bruit, no JVD, supple, symmetrical, trachea midline and thyroid not enlarged, symmetric, no tenderness/mass/nodules Lymph nodes: Cervical, supraclavicular, and axillary nodes normal. Resp: clear to auscultation bilaterally Back: symmetric, no curvature. ROM normal. No CVA tenderness. Cardio: regular rate and rhythm, S1, S2 normal, no murmur, click, rub or gallop GI: soft, non-tender; bowel sounds normal; no masses,  no  organomegaly Extremities: extremities normal, atraumatic, no cyanosis or edema and Homans sign is negative, no sign of DVT Neurologic: Grossly normal Previous colectomy incisions are healing well  Diagnostic Studies & Laboratory data:     Recent Radiology Findings:   Dg  Chest 2 View  Result Date: 11/19/2017 CLINICAL DATA:  Left lung mass. EXAM: CHEST  2 VIEW COMPARISON:  Chest x-ray dated 02/28/2017 and chest CT dated 11/07/2017 FINDINGS: Heart size and pulmonary vascularity are normal. The lungs appear clear. The lung mass seen on CT scan is not apparent on this chest x-ray. No effusions. Bones appear normal. IMPRESSION: Normal chest. Electronically Signed   By: Lorriane Shire M.D.   On: 11/19/2017 09:29   Ct Super D Chest Wo Contrast  Result Date: 11/07/2017 CLINICAL DATA:  Lung nodule EXAM: CT CHEST WITHOUT CONTRAST TECHNIQUE: Multidetector CT imaging of the chest was performed using thin slice collimation for electromagnetic bronchoscopy planning purposes, without intravenous contrast. COMPARISON:  07/03/2017 FINDINGS: Cardiovascular: The heart size is normal.  No pericardial effusion. Mediastinum/Nodes: No mediastinal lymphadenopathy. No evidence for gross hilar lymphadenopathy although assessment is limited by the lack of intravenous contrast on today's study. The esophagus has normal imaging features. There is no axillary lymphadenopathy. Lungs/Pleura: 7 x 11 mm left lower lobe sub solid nodule is unchanged. No other suspicious pulmonary nodule or mass. Upper Abdomen: Similar appearance of probable cyst upper pole left kidney. Musculoskeletal: Bone windows reveal no worrisome lytic or sclerotic osseous lesions. IMPRESSION: 1. Stable left lower lobe pulmonary nodule. No new or progressive findings. Electronically Signed   By: Misty Stanley M.D.   On: 11/07/2017 11:23   I have independently reviewed the above radiology studies  and reviewed the findings with the patient.   Ct Super D Chest  Wo Contrast  Result Date: 07/03/2017 CLINICAL DATA:  Follow up pulmonary nodule. Previous colectomy. No history of malignancy. Former smoker. EXAM: CT CHEST WITHOUT CONTRAST TECHNIQUE: Multidetector CT imaging of the chest was performed using thin slice collimation for electromagnetic bronchoscopy planning purposes, without intravenous contrast. COMPARISON:  Chest CT 06/14/2017. PET-CT 06/25/2017. Abdominal CT 12/25/2016 and 02/28/2017. FINDINGS: Cardiovascular: No significant vascular findings on noncontrast imaging. The heart size is normal. There is no pericardial effusion. Mediastinum/Nodes: There are no enlarged mediastinal, hilar or axillary lymph nodes. The thyroid gland, trachea and esophagus demonstrate no significant findings. Lungs/Pleura: There is no pleural effusion. The sub solid left lower lobe nodule is unchanged from the recent prior studies, measuring 11 x 7 mm on image 71. There are no new or enlarging pulmonary nodules. There is no endobronchial lesion. Upper abdomen: Borderline hepatic steatosis. No evidence of adrenal mass. Probable small cysts in the upper pole of the right kidney. Musculoskeletal/Chest wall: There is no chest wall mass or suspicious osseous finding. IMPRESSION: 1. Follow-up imaging for electro magnetic bronchoscopy planning. 2. The sub solid left lower lobe nodule is unchanged from the recent prior studies. 3. No acute findings. Electronically Signed   By: Richardean Sale M.D.   On: 07/03/2017 10:53     Ct Chest Wo Contrast  Result Date: 06/14/2017 CLINICAL DATA:  Follow-up nodule EXAM: CT CHEST WITHOUT CONTRAST TECHNIQUE: Multidetector CT imaging of the chest was performed following the standard protocol without IV contrast. COMPARISON:  02/28/2017 CT I FINDINGS: Cardiovascular: No evidence of aortic aneurysm or intramural hematoma. Mediastinum/Nodes: No abnormal mediastinal adenopathy. Visualized thyroid is unremarkable. Esophagus is unremarkable. No pericardial  effusion. Lungs/Pleura: Left lower lobe mixed solid and ground-glass opacity on image 88 measures 1.4 cm and is stable dating back to February 13th of this year. The solid component measures 8 mm. No new nodule. Upper Abdomen: No acute abnormality. Musculoskeletal: No acute bony pathology. IMPRESSION: Persistence solid and sub solid left lower lobe  nodule. The solid component is 8 mm. This nodule is highly suspicious for malignancy. Thoracic surgery consultation is recommended. Follow-up non-contrast CT recommended at 3-6 months to confirm persistence. If unchanged, and solid component remains <6 mm, annual CT is recommended until 5 years of stability has been established. If persistent these nodules should be considered highly suspicious if the solid component of the nodule is 6 mm or greater in size and enlarging. This recommendation follows the consensus statement: Guidelines for Management of Incidental Pulmonary Nodules Detected on CT Images: From the Fleischner Society 2017; Radiology 2017; 284:228-243. None Electronically Signed   By: Marybelle Killings M.D.   On: 06/14/2017 09:12   Nm Pet Image Initial (pi) Skull Base To Thigh  Result Date: 06/25/2017 CLINICAL DATA:  Initial treatment strategy for persistent subsolid left lower lobe pulmonary nodule. Status post laparoscopic sigmoid colectomy 05/17/2017 for recurrent sigmoid diverticulitis. EXAM: NUCLEAR MEDICINE PET SKULL BASE TO THIGH TECHNIQUE: 10.9 mCi F-18 FDG was injected intravenously. Full-ring PET imaging was performed from the skull base to thigh after the radiotracer. CT data was obtained and used for attenuation correction and anatomic localization. FASTING BLOOD GLUCOSE:  Value: 103 mg/dl COMPARISON:  06/14/2017 chest CT.  02/28/2017 CT abdomen/pelvis. FINDINGS: NECK: No hypermetabolic lymph nodes in the neck. Hypermetabolic 1.1 cm mass centered at the inferior margin of the deep lobe of the left parotid gland (series 4/image 26) with max SUV 7.4.  Symmetric hypermetabolism in Waldeyer's ring without discrete mass correlate on the CT images, probably reactive. CHEST: Subsolid irregular 1.2 cm left lower lobe pulmonary nodule (series 8/image 40) demonstrates no significant metabolism (max SUV 0.7). No acute consolidative airspace disease, lung masses or additional significant pulmonary nodules. No enlarged or hypermetabolic axillary, mediastinal or hilar lymph nodes. No pneumothorax. No pleural effusions. ABDOMEN/PELVIS: No abnormal hypermetabolic activity within the liver, pancreas, adrenal glands, or spleen. No hypermetabolic lymph nodes in the abdomen or pelvis. Fat stranding in the midline subcutaneous ventral abdominal wall with associated mild patchy hypermetabolism is compatible with expected postsurgical inflammatory change. No superficial fluid collections. Status post subtotal distal colectomy with intact appearing distal colonic anastomosis. Mild scattered diverticulosis throughout the remnant colon. Atherosclerotic nonaneurysmal abdominal aorta. Mild diffuse hepatic steatosis. Small simple renal cysts in both kidneys, largest 1.3 cm in the medial upper right kidney. Top-normal size prostate. SKELETON: No focal hypermetabolic activity to suggest skeletal metastasis. IMPRESSION: 1. Subsolid 1.2 cm left lower lobe pulmonary nodule demonstrates no significant metabolism. Differential continues to include low grade lung adenocarcinoma. Thoracic surgical consultation advised for consideration of surgical resection. Annual CT is recommended until 5 years of stability has been established if surgery is not elected. This recommendation follows the consensus statement: Guidelines for Management of Incidental Pulmonary Nodules Detected on CT Images: From the Fleischner Society 2017; Radiology 2017; 284:228-243. 2. Hypermetabolic 1.1 cm mass centered at the inferior margin of the deep lobe of the left parotid gland, suspicious for primary left parotid neoplasm,  potentially malignant. Dedicated left parotid ultrasound with ultrasound-guided biopsy recommended. 3. Expected postsurgical changes in the midline ventral abdominal wall from interval subtotal distal colectomy. No fluid collections. Electronically Signed   By: Ilona Sorrel M.D.   On: 06/25/2017 12:05       Recent Lab Findings: Lab Results  Component Value Date   WBC 6.8 11/19/2017   HGB 15.4 11/19/2017   HCT 46.6 11/19/2017   PLT 332 11/19/2017   GLUCOSE 96 11/19/2017   ALT 55 11/19/2017   AST 58 (H)  11/19/2017   NA 136 11/19/2017   K 5.4 (H) 11/19/2017   CL 104 11/19/2017   CREATININE 1.04 11/19/2017   BUN 11 11/19/2017   CO2 21 (L) 11/19/2017   INR 0.97 11/19/2017   HGBA1C 6.0 (H) 05/08/2017   PFT's- reviewed done prior to last visit  FEV1 2.77 73% DLCO 26 81%  Conclusions: Minimal airway obstruction is present suggesting small airway disease. Pulmonary Function Diagnosis: Mild Obstructive Airways Disease Insignificant response to bronchodilator Normal Difusion    Assessment / Plan:   1/persistent left lower lobe groundglass opacity suggestive of low-grade lung cancer.  I discussed with the patient proceeding with bronchoscopy with enb for biopsy.  I have explained to he and his wife the rate of false negative lesions in addition.  Risks and options were discussed in detail  The goals risks and alternatives of the planned surgical procedure Procedure(s): [place] (N/A) LUNG BIOPSY (N/A)  have been discussed with the patient in detail. The risks of the procedure including death, infection, pneumothorax stroke, myocardial infarction, bleeding, blood transfusion have all been discussed specifically.  I have quoted Donnella Sham a 1 % of perioperative mortality and a complication rate as high as 30 %. The patient's questions have been answered.Yordi Delano Metz is willing  to proceed with the planned procedure.  2/Hypermetabolic 1.1 cm  mass centered at the inferior margin of the deep lobe of the left parotid gland -  Seen by ENT Wilburn Cornelia -benign    Grace Isaac MD      [address] [place] [address] Office [phone]   Beeper [phone]  11/19/2017 10:49 AM

## 2017-11-19 NOTE — Discharge Instructions (Signed)
Flexible Bronchoscopy, Care After °This sheet gives you information about how to care for yourself after your procedure. Your health care provider may also give you more specific instructions. If you have problems or questions, contact your health care provider. °What can I expect after the procedure? °After the procedure, it is common to have the following symptoms for 24-48 hours: °· A cough that is worse than it was before the procedure. °· A low-grade fever. °· A sore throat or hoarse voice. °· Small streaks of blood in the mucus from your lungs (sputum), if tissue samples were removed (biopsy). ° °Follow these instructions at home: °Eating and drinking °· Do not eat or drink anything (including water) for 2 hours after your procedure, or until your numbing medicine (local anesthetic) has worn off. Having a numb throat increases your risk of burning yourself or choking. °· After your numbness is gone and your cough and gag reflexes have returned, you may start eating only soft foods and slowly drinking liquids. °· The day after the procedure, return to your normal diet. °Driving °· Do not drive for 24 hours if you were given a medicine to help you relax (sedative). °· Do not drive or use heavy machinery while taking prescription pain medicine. °General instructions °· Take over-the-counter and prescription medicines only as told by your health care provider. °· Return to your normal activities as told by your health care provider. Ask your health care provider what activities are safe for you. °· Do not use any products that contain nicotine or tobacco, such as cigarettes and e-cigarettes. If you need help quitting, ask your health care provider. °· Keep all follow-up visits as told by your health care provider. This is important, especially if you had a biopsy taken. °Get help right away if: °· You have shortness of breath that gets worse. °· You become light-headed or feel like you might faint. °· You have  chest pain. °· You cough up more than a small amount of blood. °· The amount of blood you cough up increases. °Summary °· Common symptoms in the 24-48 hours following a flexible bronchoscopy include cough, low-grade fever, sore throat or hoarse voice, and blood-streaked mucus from the lungs (if you had a biopsy). °· Do not eat or drink anything (including water) for 2 hours after your procedure, or until your local anesthetic has worn off. You can return to your normal diet the day after the procedure. °· Get help right away if you develop worsening shortness of breath, have chest pain, become light-headed, or cough up more than a small amount of blood. °This information is not intended to replace advice given to you by your health care provider. Make sure you discuss any questions you have with your health care provider. °Document Released: 05/18/2005 Document Revised: 11/16/2016 Document Reviewed: 11/16/2016 °Elsevier Interactive Patient Education © 2017 Elsevier Inc. ° °

## 2017-11-19 NOTE — Transfer of Care (Signed)
Immediate Anesthesia Transfer of Care Note  Patient: Dakota Santos  Procedure(s) Performed: VIDEO BRONCHOSCOPY WITH ENDOBRONCHIAL NAVIGATION (N/A Chest) LUNG BIOPSY (N/A Chest)  Patient Location: PACU  Anesthesia Type:General  Level of Consciousness: awake, oriented and patient cooperative  Airway & Oxygen Therapy: Patient Spontanous Breathing and Patient connected to face mask oxygen  Post-op Assessment: Report given to RN and Post -op Vital signs reviewed and stable  Post vital signs: Reviewed and stable  Last Vitals:  Vitals:   11/19/17 0930  BP: 133/78  Pulse: (!) 54  Resp: 19  Temp: 36.6 C  SpO2: 99%    Last Pain: There were no vitals filed for this visit.       Complications: No apparent anesthesia complications

## 2017-11-19 NOTE — Anesthesia Preprocedure Evaluation (Addendum)
Anesthesia Evaluation  Patient identified by MRN, date of birth, ID band Patient awake    Reviewed: Allergy & Precautions, NPO status , Patient's Chart, lab work & pertinent test results  Airway Mallampati: III  TM Distance: >3 FB Neck ROM: Full    Dental  (+) Teeth Intact, Dental Advisory Given   Pulmonary former smoker,    breath sounds clear to auscultation       Cardiovascular hypertension, Pt. on medications  Rhythm:Regular Rate:Normal     Neuro/Psych negative neurological ROS  negative psych ROS   GI/Hepatic negative GI ROS, Neg liver ROS,   Endo/Other  negative endocrine ROS  Renal/GU negative Renal ROS     Musculoskeletal negative musculoskeletal ROS (+)   Abdominal Normal abdominal exam  (+)   Peds  Hematology negative hematology ROS (+)   Anesthesia Other Findings - HLD  Reproductive/Obstetrics                            Anesthesia Physical Anesthesia Plan  ASA: II  Anesthesia Plan: General   Post-op Pain Management:    Induction: Intravenous  PONV Risk Score and Plan: 3 and Ondansetron and Midazolam  Airway Management Planned: Oral ETT  Additional Equipment: None  Intra-op Plan:   Post-operative Plan: Extubation in OR  Informed Consent: I have reviewed the patients History and Physical, chart, labs and discussed the procedure including the risks, benefits and alternatives for the proposed anesthesia with the patient or authorized representative who has indicated his/her understanding and acceptance.   Dental advisory given  Plan Discussed with: CRNA  Anesthesia Plan Comments:         Anesthesia Quick Evaluation

## 2017-11-19 NOTE — Anesthesia Postprocedure Evaluation (Signed)
Anesthesia Post Note  Patient: Dakota Santos  Procedure(s) Performed: VIDEO BRONCHOSCOPY WITH ENDOBRONCHIAL NAVIGATION (N/A Chest) LUNG BIOPSY (N/A Chest)     Patient location during evaluation: PACU Anesthesia Type: General Level of consciousness: awake and alert Pain management: pain level controlled Vital Signs Assessment: post-procedure vital signs reviewed and stable Respiratory status: spontaneous breathing, nonlabored ventilation, respiratory function stable and patient connected to nasal cannula oxygen Cardiovascular status: blood pressure returned to baseline and stable Postop Assessment: no apparent nausea or vomiting Anesthetic complications: no    Last Vitals:  Vitals:   11/19/17 1415 11/19/17 1423  BP: 128/90   Pulse: (!) 53   Resp: 12   Temp:  36.7 C  SpO2: 98%     Last Pain:  Vitals:   11/19/17 1423  PainSc: 0-No pain                 Effie Berkshire

## 2017-11-19 NOTE — Anesthesia Procedure Notes (Addendum)
Procedure Name: Intubation Date/Time: 11/19/2017 11:40 AM Performed by: Effie Berkshire, MD Pre-anesthesia Checklist: Patient identified, Patient being monitored, Timeout performed, Emergency Drugs available and Suction available Patient Re-evaluated:Patient Re-evaluated prior to induction Oxygen Delivery Method: Circle system utilized Preoxygenation: Pre-oxygenation with 100% oxygen Induction Type: IV induction Ventilation: Mask ventilation without difficulty and Oral airway inserted - appropriate to patient size Laryngoscope Size: Mac and 4 Grade View: Grade II Tube type: Oral Tube size: 7.5 mm Number of attempts: 2 Airway Equipment and Method: Stylet Placement Confirmation: ETT inserted through vocal cords under direct vision,  positive ETCO2 and breath sounds checked- equal and bilateral Secured at: 22 cm Tube secured with: Tape Dental Injury: Teeth and Oropharynx as per pre-operative assessment

## 2017-11-20 ENCOUNTER — Encounter (HOSPITAL_COMMUNITY): Payer: Self-pay | Admitting: Cardiothoracic Surgery

## 2017-11-20 NOTE — Op Note (Signed)
NAME:  SHNEUR, WHITTENBURG NO.:  [id number]  MEDICAL RECORD NO.:  [id number]  LOCATION:  [facility]                         FACILITY:  [facility]  PHYSICIAN:  Lanelle Bal, MD    DATE OF BIRTH:  08-Dec-1960  DATE OF PROCEDURE:  11/19/2017 DATE OF DISCHARGE:                              OPERATIVE REPORT   PREOPERATIVE DIAGNOSIS:  Left lower lobe lung mass.  POSTOPERATIVE DIAGNOSIS:  Left lower lobe lung mass, path pending.  PROCEDURE PERFORMED:  Bronchoscopy with super D navigation bronchoscopy and multiple lung biopsies.  SURGEON:  Lanelle Bal, MD.  BRIEF HISTORY:  The patient is a 57 year old male, who presented in February 2018 with increasing complaints of abdominal pain and ultimately had a partial colectomy done for diverticular disease.  On his evaluation, there was a less than 1 cm ground-glass partial solid mass noted on abdominal CT.  Further scans of this through August 2018 showed this area to be persistent.  The patient was then referred to Thoracic Surgery for consideration of biopsy.  A repeat super D CT scan was done for navigation purposes and also to evaluate the change of the mass over time that did not appear to change, but because of its somewhat suspicious character, it was recommended to the patient that we attempt biopsy.  The lesion was very centrally located in the left lower lobe.  Navigation bronchoscopy was recommended to the patient.  It was made clear to him that a negative biopsy would not necessarily rule out that the mass is malignant.  Risks and options were discussed with the patient.  He agreed and signed informed consent.  DESCRIPTION OF PROCEDURE:  Preoperatively based on the super D CT scan, the target was identified and preoperative planning was created and loaded into the super D or monitor.  The patient underwent general endotracheal anesthesia without incident.  An 8.5-French single-lumen endotracheal tube was placed  without difficulty.  Appropriate time-out was performed.  We then proceeded with formal bronchoscopy to the subsegmental level both the right and left tracheobronchial tree without evidence of endobronchial lesions.  We then positioned the bronchoscope, placed the half working channel LG into position, registered the system. We then proceeded with planned navigation to the left lower lobe lung mass 1 way or close to the peripheral target.  We then calibrated this system with a fluoro suite of the chest and refined our target image. We were able to navigate to the mass to release the segmental 1 cm or less distance.  We then proceeded with a series of biopsies first with a needle brush, needle aspiration, and multiple smears were made for quick evaluation.  These slides were sent to Pathology and while waiting for results, proceeded with further biopsies including with triple brush and small biopsy forceps.  On the first quick smears, no definite malignancy could be identified, though 1 slide of a needle aspiration had 1 small group of "atypical cells."  With this much, we then sent the remaining tissue for permanent evaluation.  Blood loss was minimal.  The bronchoscope was then used to clear the tracheobronchial tree of all secretions.  The scope was removed.  The patient was then  awakened in the operating room and extubated.  Prior to extubation, fluoro exam of the chest showed no evidence of pneumothorax.  The patient was transferred to the recovery room for postoperative observation having tolerated the procedure without complication.     Lanelle Bal, MD     EG/MEDQ  D:  11/20/2017  T:  11/20/2017  Job:  [id number]

## 2017-11-21 NOTE — Progress Notes (Signed)
[address]       [place] [address]             [phone]                    Emmanuell M Hildebran Stevinson Medical Record #[id number] Date of Birth: January 25, 1961  Referring: Kelton Pillar, MD Primary Care: Kelton Pillar, MD  Chief Complaint:   Left lung Mass  History of Present Illness:    Dakota Santos 57 y.o. male is seen in follow-up for a left lung mass noted incidentially on ct of abdomen done in Feb 2018 for abdominal pain. The patient had repeated episodes of abdominal pain since February. In July he underwent sigmoid colon resection for repeated episodes of diverticulitis.   Patient is long term smoker more then 40 years, he quit in April 2018.   Patient has previous dx of COPD, he had been prescribed Spiriva, but could not tolerate and stop using. He has no pulmonary symptoms at rest does note sob if climbs two flights of stairs  No previous cardiac history  Navigation bronchoscopy/enb with lung biopsy of the left lower lobe lung mass was done several days ago patient returns today to review pathologic findings.  The patient has had abdominal CT in February 2018, again and April 2018, CT scan of the chest August 2018 and again late December 2018-series of the scans left lower lobe lung lesion appears stable.  Is noted a biopsy was performed with navigation bronchoscopy several days ago.  1 quick stain initially was read as atypical cells, the final interpretation of all the biopsies are negative for malignancy  Current Activity/ Functional Status:  Patient is independent with mobility/ambulation, transfers, ADL's, IADL's.   Zubrod Score: At the time of surgery this patients most appropriate activity status/level should be described as: []     0    Normal activity, no symptoms [x]     1    Restricted in physical strenuous activity but ambulatory, able to do out light work []     2    Ambulatory and capable of self care, unable to do work  activities, up and about               >50 % of waking hours                              []     3    Only limited self care, in bed greater than 50% of waking hours []     4    Completely disabled, no self care, confined to bed or chair []     5    Moribund   Past Medical History:  Diagnosis Date   Diverticulitis    Hyperlipidemia    Hypertension     Past Surgical History:  Procedure Laterality Date   COLONOSCOPY     COLONOSCOPY     HERNIA REPAIR      abdomen   LAPAROSCOPIC SIGMOID COLECTOMY N/A 05/17/2017   Procedure: LAPAROSCOPIC ASSISTED SIGMOID COLECTOMY;  Surgeon: Coralie Keens, MD;  Location: [facility];  Service: General;  Laterality: N/A;   LASIK     LUNG BIOPSY N/A 11/19/2017   Procedure: LUNG BIOPSY;  Surgeon: Grace Isaac, MD;  Location: [facility];  Service: Thoracic;  Laterality: N/A;   ORIF ELBOW FRACTURE Left 09/18/2014   Procedure: OPEN REDUCTION INTERNAL FIXATION (ORIF) ELBOW/OLECRANON  FRACTURE LEFT;  Surgeon: Nita Sells, MD;  Location: [facility];  Service: Orthopedics;  Laterality: Left;   UPPER GASTROINTESTINAL ENDOSCOPY     VIDEO BRONCHOSCOPY WITH ENDOBRONCHIAL NAVIGATION N/A 11/19/2017   Procedure: VIDEO BRONCHOSCOPY WITH ENDOBRONCHIAL NAVIGATION;  Surgeon: Grace Isaac, MD;  Location: [facility];  Service: Thoracic;  Laterality: N/A;    Family History  Problem Relation Age of Onset   Atrial fibrillation Mother    Prostate cancer Father     Social History   Socioeconomic History   Marital status: Married    Spouse name: Not on file   Number of children: Not on file   Years of education: Not on file   Highest education level: Not on file  Social Needs   Financial resource strain: Not on file   Food insecurity - worry: Not on file   Food insecurity - inability: Not on file   Transportation needs - medical: Not on file   Transportation needs - non-medical: Not on file  Occupational History   Occupation: Financial trader    Tobacco Use   Smoking status: Former Smoker    Packs/day: 0.50    Years: 40.00    Pack years: 20.00    Types: Cigarettes    Last attempt to quit: 02/2017    Years since quitting: 0.7   Smokeless tobacco: Current User    Types: Snuff  Substance and Sexual Activity   Alcohol use: Yes    Comment: 1 drink a  -week- Beer, wine, or liquor   Drug use: No   Sexual activity: Not on file  Other Topics Concern   Not on file  Social History Narrative   Not on file    Social History   Tobacco Use  Smoking Status Former Smoker   Packs/day: 0.50   Years: 40.00   Pack years: 20.00   Types: Cigarettes   Last attempt to quit: 02/2017   Years since quitting: 0.7  Smokeless Tobacco Current User   Types: Snuff    Social History   Substance and Sexual Activity  Alcohol Use Yes   Comment: 1 drink a  -week- Beer, wine, or liquor     Allergies  Allergen Reactions   Simvastatin Other (See Comments)    myalgias   Penicillins Rash    Has patient had a PCN reaction causing immediate rash, facial/tongue/throat swelling, SOB or lightheadedness with hypotension: Unknown Has patient had a PCN reaction causing severe rash involving mucus membranes or skin necrosis: Unknown Has patient had a PCN reaction that required hospitalization: Unknown Has patient had a PCN reaction occurring within the last 10 years: Yes If all of the above answers are "NO", then may proceed with Cephalosporin use.     Current Outpatient Medications  Medication Sig Dispense Refill   aspirin EC 81 MG tablet Take 81 mg by mouth daily.     ezetimibe (ZETIA) 10 MG tablet Take 10 mg by mouth daily.     fluticasone (FLONASE) 50 MCG/ACT nasal spray Place 1 spray into both nostrils daily as needed for allergies or rhinitis.     lisinopril-hydrochlorothiazide (PRINZIDE,ZESTORETIC) 20-25 MG tablet Take 0.5 tablets by mouth daily. (Patient taking differently: Take 1 tablet by mouth daily. )      PROCTOZONE-HC 2.5 % rectal cream Place 1 application rectally daily.      No current facility-administered medications for this visit.     Pertinent items are noted in HPI.   Review of Systems:  Review  of Systems  Constitutional: Negative.   HENT: Negative.   Eyes: Negative.   Respiratory: Negative.   Cardiovascular: Negative.   Gastrointestinal: Negative.   Genitourinary: Negative.   Musculoskeletal: Negative.   Skin: Negative.   Neurological: Negative.   Psychiatric/Behavioral: Negative.    Immunizations: Flu up to date Blue.Reese  ]; Pneumococcal up to date [n  ];   Physical Exam: BP 110/84 (BP Location: Left Arm, Patient Position: Sitting, Cuff Size: Large)    Pulse 82    Resp 20    Ht 5\' 10"  (1.778 m)    Wt 228 lb (103.4 kg)    SpO2 98% Comment: RA   BMI 32.71 kg/m   PHYSICAL EXAMINATION: General appearance: alert, cooperative, appears stated age and no distress Head: Normocephalic, without obvious abnormality, atraumatic Neck: no adenopathy, no carotid bruit, no JVD, supple, symmetrical, trachea midline and thyroid not enlarged, symmetric, no tenderness/mass/nodules Lymph nodes: Cervical, supraclavicular, and axillary nodes normal. Resp: clear to auscultation bilaterally Back: symmetric, no curvature. ROM normal. No CVA tenderness. Cardio: regular rate and rhythm, S1, S2 normal, no murmur, click, rub or gallop GI: soft, non-tender; bowel sounds normal; no masses,  no organomegaly Extremities: extremities normal, atraumatic, no cyanosis or edema Neurologic: Grossly normal  Diagnostic Studies & Laboratory data:     Recent Radiology Findings:   Dg Chest 2 View  Result Date: 11/19/2017 CLINICAL DATA:  Left lung mass. EXAM: CHEST  2 VIEW COMPARISON:  Chest x-ray dated 02/28/2017 and chest CT dated 11/07/2017 FINDINGS: Heart size and pulmonary vascularity are normal. The lungs appear clear. The lung mass seen on CT scan is not apparent on this chest x-ray. No effusions. Bones  appear normal. IMPRESSION: Normal chest. Electronically Signed   By: Lorriane Shire M.D.   On: 11/19/2017 09:29   Dg Chest Port 1 View  Result Date: 11/19/2017 CLINICAL DATA:  57 year old male. Post endobronchial lung biopsy left lower lobe nodule. Subsequent encounter. EXAM: PORTABLE CHEST 1 VIEW COMPARISON:  11/19/2017 chest x-ray.  11/07/2017 chest C FINDINGS: No pneumothorax. Bibasilar subsegmental atelectatic changes. Heart size within normal limits. IMPRESSION: No pneumothorax. Bibasilar subsegmental atelectatic changes. Electronically Signed   By: Genia Del M.D.   On: 11/19/2017 14:35   Ct Super D Chest Wo Contrast  Result Date: 11/07/2017 CLINICAL DATA:  Lung nodule EXAM: CT CHEST WITHOUT CONTRAST TECHNIQUE: Multidetector CT imaging of the chest was performed using thin slice collimation for electromagnetic bronchoscopy planning purposes, without intravenous contrast. COMPARISON:  07/03/2017 FINDINGS: Cardiovascular: The heart size is normal.  No pericardial effusion. Mediastinum/Nodes: No mediastinal lymphadenopathy. No evidence for gross hilar lymphadenopathy although assessment is limited by the lack of intravenous contrast on today's study. The esophagus has normal imaging features. There is no axillary lymphadenopathy. Lungs/Pleura: 7 x 11 mm left lower lobe sub solid nodule is unchanged. No other suspicious pulmonary nodule or mass. Upper Abdomen: Similar appearance of probable cyst upper pole left kidney. Musculoskeletal: Bone windows reveal no worrisome lytic or sclerotic osseous lesions. IMPRESSION: 1. Stable left lower lobe pulmonary nodule. No new or progressive findings. Electronically Signed   By: Misty Stanley M.D.   On: 11/07/2017 11:23   Dg C-arm Bronchoscopy  Result Date: 11/19/2017 C-ARM BRONCHOSCOPY: Fluoroscopy was utilized by the requesting physician.  No radiographic interpretation.   I have independently reviewed the above radiology studies  and reviewed the findings  with the patient.   Ct Super D Chest Wo Contrast  Result Date: 07/03/2017 CLINICAL DATA:  Follow up pulmonary  nodule. Previous colectomy. No history of malignancy. Former smoker. EXAM: CT CHEST WITHOUT CONTRAST TECHNIQUE: Multidetector CT imaging of the chest was performed using thin slice collimation for electromagnetic bronchoscopy planning purposes, without intravenous contrast. COMPARISON:  Chest CT 06/14/2017. PET-CT 06/25/2017. Abdominal CT 12/25/2016 and 02/28/2017. FINDINGS: Cardiovascular: No significant vascular findings on noncontrast imaging. The heart size is normal. There is no pericardial effusion. Mediastinum/Nodes: There are no enlarged mediastinal, hilar or axillary lymph nodes. The thyroid gland, trachea and esophagus demonstrate no significant findings. Lungs/Pleura: There is no pleural effusion. The sub solid left lower lobe nodule is unchanged from the recent prior studies, measuring 11 x 7 mm on image 71. There are no new or enlarging pulmonary nodules. There is no endobronchial lesion. Upper abdomen: Borderline hepatic steatosis. No evidence of adrenal mass. Probable small cysts in the upper pole of the right kidney. Musculoskeletal/Chest wall: There is no chest wall mass or suspicious osseous finding. IMPRESSION: 1. Follow-up imaging for electro magnetic bronchoscopy planning. 2. The sub solid left lower lobe nodule is unchanged from the recent prior studies. 3. No acute findings. Electronically Signed   By: Richardean Sale M.D.   On: 07/03/2017 10:53     Ct Chest Wo Contrast  Result Date: 06/14/2017 CLINICAL DATA:  Follow-up nodule EXAM: CT CHEST WITHOUT CONTRAST TECHNIQUE: Multidetector CT imaging of the chest was performed following the standard protocol without IV contrast. COMPARISON:  02/28/2017 CT I FINDINGS: Cardiovascular: No evidence of aortic aneurysm or intramural hematoma. Mediastinum/Nodes: No abnormal mediastinal adenopathy. Visualized thyroid is unremarkable.  Esophagus is unremarkable. No pericardial effusion. Lungs/Pleura: Left lower lobe mixed solid and ground-glass opacity on image 88 measures 1.4 cm and is stable dating back to February 13th of this year. The solid component measures 8 mm. No new nodule. Upper Abdomen: No acute abnormality. Musculoskeletal: No acute bony pathology. IMPRESSION: Persistence solid and sub solid left lower lobe nodule. The solid component is 8 mm. This nodule is highly suspicious for malignancy. Thoracic surgery consultation is recommended. Follow-up non-contrast CT recommended at 3-6 months to confirm persistence. If unchanged, and solid component remains <6 mm, annual CT is recommended until 5 years of stability has been established. If persistent these nodules should be considered highly suspicious if the solid component of the nodule is 6 mm or greater in size and enlarging. This recommendation follows the consensus statement: Guidelines for Management of Incidental Pulmonary Nodules Detected on CT Images: From the Fleischner Society 2017; Radiology 2017; 284:228-243. None Electronically Signed   By: Marybelle Killings M.D.   On: 06/14/2017 09:12   Nm Pet Image Initial (pi) Skull Base To Thigh  Result Date: 06/25/2017 CLINICAL DATA:  Initial treatment strategy for persistent subsolid left lower lobe pulmonary nodule. Status post laparoscopic sigmoid colectomy 05/17/2017 for recurrent sigmoid diverticulitis. EXAM: NUCLEAR MEDICINE PET SKULL BASE TO THIGH TECHNIQUE: 10.9 mCi F-18 FDG was injected intravenously. Full-ring PET imaging was performed from the skull base to thigh after the radiotracer. CT data was obtained and used for attenuation correction and anatomic localization. FASTING BLOOD GLUCOSE:  Value: 103 mg/dl COMPARISON:  06/14/2017 chest CT.  02/28/2017 CT abdomen/pelvis. FINDINGS: NECK: No hypermetabolic lymph nodes in the neck. Hypermetabolic 1.1 cm mass centered at the inferior margin of the deep lobe of the left parotid  gland (series 4/image 26) with max SUV 7.4. Symmetric hypermetabolism in Waldeyer's ring without discrete mass correlate on the CT images, probably reactive. CHEST: Subsolid irregular 1.2 cm left lower lobe pulmonary nodule (series 8/image 40)  demonstrates no significant metabolism (max SUV 0.7). No acute consolidative airspace disease, lung masses or additional significant pulmonary nodules. No enlarged or hypermetabolic axillary, mediastinal or hilar lymph nodes. No pneumothorax. No pleural effusions. ABDOMEN/PELVIS: No abnormal hypermetabolic activity within the liver, pancreas, adrenal glands, or spleen. No hypermetabolic lymph nodes in the abdomen or pelvis. Fat stranding in the midline subcutaneous ventral abdominal wall with associated mild patchy hypermetabolism is compatible with expected postsurgical inflammatory change. No superficial fluid collections. Status post subtotal distal colectomy with intact appearing distal colonic anastomosis. Mild scattered diverticulosis throughout the remnant colon. Atherosclerotic nonaneurysmal abdominal aorta. Mild diffuse hepatic steatosis. Small simple renal cysts in both kidneys, largest 1.3 cm in the medial upper right kidney. Top-normal size prostate. SKELETON: No focal hypermetabolic activity to suggest skeletal metastasis. IMPRESSION: 1. Subsolid 1.2 cm left lower lobe pulmonary nodule demonstrates no significant metabolism. Differential continues to include low grade lung adenocarcinoma. Thoracic surgical consultation advised for consideration of surgical resection. Annual CT is recommended until 5 years of stability has been established if surgery is not elected. This recommendation follows the consensus statement: Guidelines for Management of Incidental Pulmonary Nodules Detected on CT Images: From the Fleischner Society 2017; Radiology 2017; 284:228-243. 2. Hypermetabolic 1.1 cm mass centered at the inferior margin of the deep lobe of the left parotid gland,  suspicious for primary left parotid neoplasm, potentially malignant. Dedicated left parotid ultrasound with ultrasound-guided biopsy recommended. 3. Expected postsurgical changes in the midline ventral abdominal wall from interval subtotal distal colectomy. No fluid collections. Electronically Signed   By: Ilona Sorrel M.D.   On: 06/25/2017 12:05       Recent Lab Findings: Lab Results  Component Value Date   WBC 6.8 11/19/2017   HGB 15.4 11/19/2017   HCT 46.6 11/19/2017   PLT 332 11/19/2017   GLUCOSE 96 11/19/2017   ALT 55 11/19/2017   AST 58 (H) 11/19/2017   NA 136 11/19/2017   K 5.4 (H) 11/19/2017   CL 104 11/19/2017   CREATININE 1.04 11/19/2017   BUN 11 11/19/2017   CO2 21 (L) 11/19/2017   INR 0.97 11/19/2017   HGBA1C 6.0 (H) 05/08/2017   PFT's- reviewed done prior to last visit  FEV1 2.77 73% DLCO 26 81%  Conclusions: Minimal airway obstruction is present suggesting small airway disease. Pulmonary Function Diagnosis: Mild Obstructive Airways Disease Insignificant response to bronchodilator Normal Difusion   PATH:Diagnosis Lung, biopsy, Left Lower Lobe - BENIGN LUNG PARENCHYMA. - THERE IS NO EVIDENCE OF MALIGNANCY. Enid Cutter MD Cytology: Diagnosis WANG, FINE NEEDLE ASPIRATION, LLL, E (SPECIMEN 5 OF 5, COLLECTED 11/19/17): NO MALIGNANT CELLS IDENTIFIED. Enid Cutter MD Acc#: Chart: Phone:  Fax: LMP: Visit#: [id number].[email or web address] CC: Kelton Pillar, MD Curt Bears, MD CYTOPATHOLOGY REPORT Adequacy Reason Satisfactory For Evaluation. Diagnosis TRANSBRONCHIAL NEEDLE ASPIRATION (D) NAVIGATION LEFT LOWER LOBE BRUSHING (SPECIMEN 4 OF 5, COLLECTED ON 11/19/2017): NO MALIGNANT CELLS IDENTIFIED. Preliminary Diagnosis DX: BENIGN (DR . OUT OF ROOM) ([facility]) Enid Cutter MD Pathologist, Electronic Signature (Case signed 11/20/2017) Adequacy Reason Satisfactory For Evaluation. Diagnosis BRONCHIAL BRUSHING, LLL, TRIPLE BRUSH, C (SPECIMEN 3 OF 5, COLLECTED 11/19/17): NO  MALIGNANT CELLS IDENTIFIED. Enid Cutter MD Adequacy Reason Satisfactory For Evaluation. Diagnosis TRANSBRONCHIAL NEEDLE ASPIRATION, NAVIGATION, LLL, B (SPECIMEN 2 OF 5, COLLECTED 11/19/17): NO MALIGNANT CELLS IDENTIFIED. Preliminary Diagnosis DX: RARE ATYPICAL SUSP FOR MALIGNANCY ([facility]) Enid Cutter MD Adequacy Reason Satisfactory For Evaluation. Diagnosis TRANSBRONCHIAL NEEDLE ASPIRATION (A) NAVIGATION LEFT LOWER LOBE BRUSHING (SPECIMEN 1 OF 5, COLLECTED ON 11/19/2017): NO MALIGNANT CELLS  IDENTIFIED. Preliminary Diagnosis DX: BENIGN BRONCHS ([facility]) Enid Cutter MD Pathologist, Electronic Signature  Assessment / Plan:  I reviewed with the patient the pathologic findings of the navigation bronchoscopy done several days ago.  Made it clear to him that a negative biopsy does have a 10-15% false negative rate.  The lesion  Evident on the CT scan have been stable since February, however this does not preclude a slow growing adenocarcinoma.  The patient is aware of this.  He is agreeable to return in 3-4 months with a follow-up CT scan.  At that point if we see any enlargement of the area will proceed with left lower lobectomy.  The patient's questions were answered and is agreeable with this approach.    2/Hypermetabolic 1.1 cm mass centered at the inferior margin of the deep lobe of the left parotid gland -  Seen by ENT Wilburn Cornelia -benign    Grace Isaac MD      [address] [place] [address] Office [phone]   [place]eeper [phone]  11/22/2017 5:40 PM

## 2017-11-22 ENCOUNTER — Encounter: Payer: Self-pay | Admitting: Cardiothoracic Surgery

## 2017-11-22 ENCOUNTER — Ambulatory Visit (INDEPENDENT_AMBULATORY_CARE_PROVIDER_SITE_OTHER): Payer: 59 | Admitting: Cardiothoracic Surgery

## 2017-11-22 ENCOUNTER — Other Ambulatory Visit: Payer: Self-pay

## 2017-11-22 VITALS — BP 110/84 | HR 82 | Resp 20 | Ht 70.0 in | Wt 228.0 lb

## 2017-11-22 DIAGNOSIS — R911 Solitary pulmonary nodule: Secondary | ICD-10-CM

## 2017-11-22 DIAGNOSIS — R918 Other nonspecific abnormal finding of lung field: Secondary | ICD-10-CM | POA: Diagnosis not present

## 2018-02-03 ENCOUNTER — Other Ambulatory Visit: Payer: Self-pay | Admitting: Cardiothoracic Surgery

## 2018-02-03 DIAGNOSIS — R911 Solitary pulmonary nodule: Secondary | ICD-10-CM

## 2018-02-11 ENCOUNTER — Ambulatory Visit: Payer: 59 | Admitting: Cardiothoracic Surgery

## 2018-03-27 ENCOUNTER — Ambulatory Visit
Admission: RE | Admit: 2018-03-27 | Discharge: 2018-03-27 | Disposition: A | Discharge status: Home / Self Care | Payer: 59 | Source: Ambulatory Visit | Attending: Cardiothoracic Surgery | Admitting: Cardiothoracic Surgery

## 2018-03-27 ENCOUNTER — Encounter: Payer: Self-pay | Admitting: Cardiothoracic Surgery

## 2018-03-27 ENCOUNTER — Ambulatory Visit: Payer: 59 | Admitting: Cardiothoracic Surgery

## 2018-03-27 VITALS — BP 107/76 | HR 66 | Resp 20 | Ht 70.0 in | Wt 228.0 lb

## 2018-03-27 DIAGNOSIS — R911 Solitary pulmonary nodule: Secondary | ICD-10-CM

## 2018-03-27 NOTE — Progress Notes (Signed)
[address]       [place] [address]             [phone]                    Fredi M Kreh [facility] Record #[id number] Date of Birth: 1960/12/17  Referring: Kelton Pillar, MD Primary Care: Kelton Pillar, MD  Chief Complaint:   Left lung Mass  History of Present Illness:    SKYLEN Santos 57 y.o. male is seen in follow-up for a left lung mass noted incidentially on ct of abdomen done in Feb 2018 for abdominal pain. The patient had repeated episodes of abdominal pain since February. In July 2018  he underwent sigmoid colon resection for repeated episodes of diverticulitis.   Patient is long term smoker more then 40 years, he quit in April 2018.   Patient has previous dx of COPD, he had been prescribed Spiriva, but could not tolerate and stop using. He has no pulmonary symptoms at rest does note sob if climbs two flights of stairs  No previous cardiac history   The patient has had abdominal CT in February 2018, again and April 2018, CT scan of the chest August 2018 and again late December 2018-series of the scans left lower lobe lung lesion appears stable.   a biopsy was performed with navigation bronchoscopy January 2019 1 quick stain initially was read as atypical cells, the final interpretation of all the biopsies are negative for malignancy  Patient returns today with a follow-up CT scan, previously a PET scan was done before the biopsy in January 2019  Current Activity/ Functional Status:  Patient is independent with mobility/ambulation, transfers, ADL's, IADL's.   Zubrod Score: At the time of surgery this patients most appropriate activity status/level should be described as: []     0    Normal activity, no symptoms [x]     1    Restricted in physical strenuous activity but ambulatory, able to do out light work []     2    Ambulatory and capable of self care, unable to do work activities, up and about               >50 % of waking  hours                              []     3    Only limited self care, in bed greater than 50% of waking hours []     4    Completely disabled, no self care, confined to bed or chair []     5    Moribund   Past Medical History:  Diagnosis Date   Diverticulitis    Hyperlipidemia    Hypertension     Past Surgical History:  Procedure Laterality Date   COLONOSCOPY     COLONOSCOPY     HERNIA REPAIR      abdomen   LAPAROSCOPIC SIGMOID COLECTOMY N/A 05/17/2017   Procedure: LAPAROSCOPIC ASSISTED SIGMOID COLECTOMY;  Surgeon: Coralie Keens, MD;  Location: [place];  Service: General;  Laterality: N/A;   LASIK     LUNG BIOPSY N/A 11/19/2017   Procedure: LUNG BIOPSY;  Surgeon: Grace Isaac, MD;  Location: [facility];  Service: Thoracic;  Laterality: N/A;   ORIF ELBOW FRACTURE Left 09/18/2014   Procedure: OPEN REDUCTION INTERNAL FIXATION (ORIF) ELBOW/OLECRANON FRACTURE LEFT;  Surgeon:  Nita Sells, MD;  Location: [place];  Service: Orthopedics;  Laterality: Left;   UPPER GASTROINTESTINAL ENDOSCOPY     VIDEO BRONCHOSCOPY WITH ENDOBRONCHIAL NAVIGATION N/A 11/19/2017   Procedure: VIDEO BRONCHOSCOPY WITH ENDOBRONCHIAL NAVIGATION;  Surgeon: Grace Isaac, MD;  Location: [facility] OR;  Service: Thoracic;  Laterality: N/A;    Family History  Problem Relation Age of Onset   Atrial fibrillation Mother    Prostate cancer Father     Social History   Socioeconomic History   Marital status: Married    Spouse name: Not on file   Number of children: Not on file   Years of education: Not on file   Highest education level: Not on file  Occupational History   Occupation: Financial trader  Social Needs   Financial resource strain: Not on file   Food insecurity:    Worry: Not on file    Inability: Not on file   Transportation needs:    Medical: Not on file    Non-medical: Not on file  Tobacco Use   Smoking status: Former Smoker    Packs/day: 0.50    Years: 40.00    Pack  years: 20.00    Types: Cigarettes    Last attempt to quit: 02/2017    Years since quitting: 1.1   Smokeless tobacco: Current User    Types: Snuff  Substance and Sexual Activity   Alcohol use: Yes    Comment: 1 drink a  -week- Beer, wine, or liquor   Drug use: No   Sexual activity: Not on file  Lifestyle   Physical activity:    Days per week: Not on file    Minutes per session: Not on file   Stress: Not on file  Relationships   Social connections:    Talks on phone: Not on file    Gets together: Not on file    Attends religious service: Not on file    Active member of club or organization: Not on file    Attends meetings of clubs or organizations: Not on file    Relationship status: Not on file   Intimate partner violence:    Fear of current or ex partner: Not on file    Emotionally abused: Not on file    Physically abused: Not on file    Forced sexual activity: Not on file  Other Topics Concern   Not on file  Social History Narrative   Not on file    Social History   Tobacco Use  Smoking Status Former Smoker   Packs/day: 0.50   Years: 40.00   Pack years: 20.00   Types: Cigarettes   Last attempt to quit: 02/2017   Years since quitting: 1.1  Smokeless Tobacco Current User   Types: Snuff    Social History   Substance and Sexual Activity  Alcohol Use Yes   Comment: 1 drink a  -week- Beer, wine, or liquor     Allergies  Allergen Reactions   Simvastatin Other (See Comments)    myalgias   Penicillins Rash    Has patient had a PCN reaction causing immediate rash, facial/tongue/throat swelling, SOB or lightheadedness with hypotension: Unknown Has patient had a PCN reaction causing severe rash involving mucus membranes or skin necrosis: Unknown Has patient had a PCN reaction that required hospitalization: Unknown Has patient had a PCN reaction occurring within the last 10 years: Yes If all of the above answers are "NO", then may proceed with  Cephalosporin use.  Current Outpatient Medications  Medication Sig Dispense Refill   aspirin EC 81 MG tablet Take 81 mg by mouth daily.     ezetimibe (ZETIA) 10 MG tablet Take 10 mg by mouth daily.     fluticasone (FLONASE) 50 MCG/ACT nasal spray Place 1 spray into both nostrils daily as needed for allergies or rhinitis.     lisinopril-hydrochlorothiazide (PRINZIDE,ZESTORETIC) 20-25 MG tablet Take 0.5 tablets by mouth daily. (Patient taking differently: Take 1 tablet by mouth daily. )     PROCTOZONE-HC 2.5 % rectal cream Place 1 application rectally daily.      No current facility-administered medications for this visit.     Pertinent items are noted in HPI.   Review of Systems:  Review of Systems  Constitutional: Negative.   HENT: Negative.   Eyes: Negative.   Respiratory: Negative.   Cardiovascular: Negative.   Gastrointestinal: Negative.   Musculoskeletal: Negative.   Skin: Negative.   Neurological: Negative.   Endo/Heme/Allergies: Negative.   Psychiatric/Behavioral: Negative.     Immunizations: Flu up to date Blue.Reese  ]; Pneumococcal up to date [n  ];   Physical Exam: BP 107/76    Pulse 66    Resp 20    Ht 5\' 10"  (1.778 m)    Wt 228 lb (103.4 kg)    SpO2 98% Comment: RA   BMI 32.71 kg/m   PHYSICAL EXAMINATION: General appearance: alert, cooperative, appears stated age and no distress Head: Normocephalic, without obvious abnormality, atraumatic Neck: no adenopathy, no carotid bruit, no JVD, supple, symmetrical, trachea midline and thyroid not enlarged, symmetric, no tenderness/mass/nodules Lymph nodes: Cervical, supraclavicular, and axillary nodes normal. Resp: clear to auscultation bilaterally Back: symmetric, no curvature. ROM normal. No CVA tenderness. Cardio: regular rate and rhythm, S1, S2 normal, no murmur, click, rub or gallop GI: Lower abdominal incisions well-healed Extremities: extremities normal, atraumatic, no cyanosis or edema and Homans sign is  negative, no sign of DVT Neurologic: Grossly normal  Diagnostic Studies & Laboratory data:     Recent Radiology Findings:   Ct Chest Wo Contrast  Result Date: 03/27/2018 CLINICAL DATA:  Follow-up lung nodule.  Previous smoker EXAM: CT CHEST WITHOUT CONTRAST TECHNIQUE: Multidetector CT imaging of the chest was performed following the standard protocol without IV contrast. COMPARISON:  Chest CT 11/07/2017, CT 06/14/2017, PET-CT 06/25/2017 FINDINGS: Cardiovascular: No significant vascular findings. Normal heart size. No pericardial effusion. Mediastinum/Nodes: No axillary supraclavicular adenopathy. No mediastinal hilar adenopathy. No pericardial effusion. Esophagus normal Lungs/Pleura: Within LEFT lower lobe 8 mm x 6 mm nodule (image 95/8) the compares with 11 mm x 7 mm. Visually the nodule appears similar. No new pulmonary nodules. Upper Abdomen: Limited view of the liver, kidneys, pancreas are unremarkable. Normal adrenal glands. Musculoskeletal: No aggressive osseous lesion. IMPRESSION: Stable LEFT lower lobe pulmonary nodule over six-month interval. Compared to 06/14/2017 also demonstrates no significant change. No metabolic activity on PET scan. Recommend follow-up CT in 12 months from current month. Electronically Signed   By: Suzy Bouchard M.D.   On: 03/27/2018 15:14   I have independently reviewed the above radiology studies  and reviewed the findings with the patient.   Ct Super D Chest Wo Contrast  Result Date: 07/03/2017 CLINICAL DATA:  Follow up pulmonary nodule. Previous colectomy. No history of malignancy. Former smoker. EXAM: CT CHEST WITHOUT CONTRAST TECHNIQUE: Multidetector CT imaging of the chest was performed using thin slice collimation for electromagnetic bronchoscopy planning purposes, without intravenous contrast. COMPARISON:  Chest CT 06/14/2017. PET-CT 06/25/2017. Abdominal CT  12/25/2016 and 02/28/2017. FINDINGS: Cardiovascular: No significant vascular findings on  noncontrast imaging. The heart size is normal. There is no pericardial effusion. Mediastinum/Nodes: There are no enlarged mediastinal, hilar or axillary lymph nodes. The thyroid gland, trachea and esophagus demonstrate no significant findings. Lungs/Pleura: There is no pleural effusion. The sub solid left lower lobe nodule is unchanged from the recent prior studies, measuring 11 x 7 mm on image 71. There are no new or enlarging pulmonary nodules. There is no endobronchial lesion. Upper abdomen: Borderline hepatic steatosis. No evidence of adrenal mass. Probable small cysts in the upper pole of the right kidney. Musculoskeletal/Chest wall: There is no chest wall mass or suspicious osseous finding. IMPRESSION: 1. Follow-up imaging for electro magnetic bronchoscopy planning. 2. The sub solid left lower lobe nodule is unchanged from the recent prior studies. 3. No acute findings. Electronically Signed   By: Richardean Sale M.D.   On: 07/03/2017 10:53     Ct Chest Wo Contrast  Result Date: 06/14/2017 CLINICAL DATA:  Follow-up nodule EXAM: CT CHEST WITHOUT CONTRAST TECHNIQUE: Multidetector CT imaging of the chest was performed following the standard protocol without IV contrast. COMPARISON:  02/28/2017 CT I FINDINGS: Cardiovascular: No evidence of aortic aneurysm or intramural hematoma. Mediastinum/Nodes: No abnormal mediastinal adenopathy. Visualized thyroid is unremarkable. Esophagus is unremarkable. No pericardial effusion. Lungs/Pleura: Left lower lobe mixed solid and ground-glass opacity on image 88 measures 1.4 cm and is stable dating back to February 13th of this year. The solid component measures 8 mm. No new nodule. Upper Abdomen: No acute abnormality. Musculoskeletal: No acute bony pathology. IMPRESSION: Persistence solid and sub solid left lower lobe nodule. The solid component is 8 mm. This nodule is highly suspicious for malignancy. Thoracic surgery consultation is recommended. Follow-up non-contrast CT  recommended at 3-6 months to confirm persistence. If unchanged, and solid component remains <6 mm, annual CT is recommended until 5 years of stability has been established. If persistent these nodules should be considered highly suspicious if the solid component of the nodule is 6 mm or greater in size and enlarging. This recommendation follows the consensus statement: Guidelines for Management of Incidental Pulmonary Nodules Detected on CT Images: From the Fleischner Society 2017; Radiology 2017; 284:228-243. None Electronically Signed   By: Marybelle Killings M.D.   On: 06/14/2017 09:12   Nm Pet Image Initial (pi) Skull Base To Thigh  Result Date: 06/25/2017 CLINICAL DATA:  Initial treatment strategy for persistent subsolid left lower lobe pulmonary nodule. Status post laparoscopic sigmoid colectomy 05/17/2017 for recurrent sigmoid diverticulitis. EXAM: NUCLEAR MEDICINE PET SKULL BASE TO THIGH TECHNIQUE: 10.9 mCi F-18 FDG was injected intravenously. Full-ring PET imaging was performed from the skull base to thigh after the radiotracer. CT data was obtained and used for attenuation correction and anatomic localization. FASTING BLOOD GLUCOSE:  Value: 103 mg/dl COMPARISON:  06/14/2017 chest CT.  02/28/2017 CT abdomen/pelvis. FINDINGS: NECK: No hypermetabolic lymph nodes in the neck. Hypermetabolic 1.1 cm mass centered at the inferior margin of the deep lobe of the left parotid gland (series 4/image 26) with max SUV 7.4. Symmetric hypermetabolism in Waldeyer's ring without discrete mass correlate on the CT images, probably reactive. CHEST: Subsolid irregular 1.2 cm left lower lobe pulmonary nodule (series 8/image 40) demonstrates no significant metabolism (max SUV 0.7). No acute consolidative airspace disease, lung masses or additional significant pulmonary nodules. No enlarged or hypermetabolic axillary, mediastinal or hilar lymph nodes. No pneumothorax. No pleural effusions. ABDOMEN/PELVIS: No abnormal hypermetabolic  activity within the liver, pancreas,  adrenal glands, or spleen. No hypermetabolic lymph nodes in the abdomen or pelvis. Fat stranding in the midline subcutaneous ventral abdominal wall with associated mild patchy hypermetabolism is compatible with expected postsurgical inflammatory change. No superficial fluid collections. Status post subtotal distal colectomy with intact appearing distal colonic anastomosis. Mild scattered diverticulosis throughout the remnant colon. Atherosclerotic nonaneurysmal abdominal aorta. Mild diffuse hepatic steatosis. Small simple renal cysts in both kidneys, largest 1.3 cm in the medial upper right kidney. Top-normal size prostate. SKELETON: No focal hypermetabolic activity to suggest skeletal metastasis. IMPRESSION: 1. Subsolid 1.2 cm left lower lobe pulmonary nodule demonstrates no significant metabolism. Differential continues to include low grade lung adenocarcinoma. Thoracic surgical consultation advised for consideration of surgical resection. Annual CT is recommended until 5 years of stability has been established if surgery is not elected. This recommendation follows the consensus statement: Guidelines for Management of Incidental Pulmonary Nodules Detected on CT Images: From the Fleischner Society 2017; Radiology 2017; 284:228-243. 2. Hypermetabolic 1.1 cm mass centered at the inferior margin of the deep lobe of the left parotid gland, suspicious for primary left parotid neoplasm, potentially malignant. Dedicated left parotid ultrasound with ultrasound-guided biopsy recommended. 3. Expected postsurgical changes in the midline ventral abdominal wall from interval subtotal distal colectomy. No fluid collections. Electronically Signed   By: Ilona Sorrel M.D.   On: 06/25/2017 12:05       Recent Lab Findings: Lab Results  Component Value Date   WBC 6.8 11/19/2017   HGB 15.4 11/19/2017   HCT 46.6 11/19/2017   PLT 332 11/19/2017   GLUCOSE 96 11/19/2017   ALT 55 11/19/2017     AST 58 (H) 11/19/2017   NA 136 11/19/2017   K 5.4 (H) 11/19/2017   CL 104 11/19/2017   CREATININE 1.04 11/19/2017   BUN 11 11/19/2017   CO2 21 (L) 11/19/2017   INR 0.97 11/19/2017   HGBA1C 6.0 (H) 05/08/2017   PFT's- reviewed done prior to last visit  FEV1 2.77 73% DLCO 26 81%  Conclusions: Minimal airway obstruction is present suggesting small airway disease. Pulmonary Function Diagnosis: Mild Obstructive Airways Disease Insignificant response to bronchodilator Normal Difusion   PATH:Diagnosis Lung, biopsy, Left Lower Lobe - BENIGN LUNG PARENCHYMA. - THERE IS NO EVIDENCE OF MALIGNANCY. Enid Cutter MD Cytology: Diagnosis WANG, FINE NEEDLE ASPIRATION, LLL, E (SPECIMEN 5 OF 5, COLLECTED 11/19/17): NO MALIGNANT CELLS IDENTIFIED. Enid Cutter MD Acc#: Chart: Phone:  Fax: LMP: Visit#: [id number].[email or web address] CC: Kelton Pillar, MD Curt Bears, MD CYTOPATHOLOGY REPORT Adequacy Reason Satisfactory For Evaluation. Diagnosis TRANSBRONCHIAL NEEDLE ASPIRATION (D) NAVIGATION LEFT LOWER LOBE BRUSHING (SPECIMEN 4 OF 5, COLLECTED ON 11/19/2017): NO MALIGNANT CELLS IDENTIFIED. Preliminary Diagnosis DX: BENIGN (DR . OUT OF ROOM) ([facility]) Enid Cutter MD Pathologist, Electronic Signature (Case signed 11/20/2017) Adequacy Reason Satisfactory For Evaluation. Diagnosis BRONCHIAL BRUSHING, LLL, TRIPLE BRUSH, C (SPECIMEN 3 OF 5, COLLECTED 11/19/17): NO MALIGNANT CELLS IDENTIFIED. Enid Cutter MD Adequacy Reason Satisfactory For Evaluation. Diagnosis TRANSBRONCHIAL NEEDLE ASPIRATION, NAVIGATION, LLL, B (SPECIMEN 2 OF 5, COLLECTED 11/19/17): NO MALIGNANT CELLS IDENTIFIED. Preliminary Diagnosis DX: RARE ATYPICAL SUSP FOR MALIGNANCY ([facility]) Enid Cutter MD Adequacy Reason Satisfactory For Evaluation. Diagnosis TRANSBRONCHIAL NEEDLE ASPIRATION (A) NAVIGATION LEFT LOWER LOBE BRUSHING (SPECIMEN 1 OF 5, COLLECTED ON 11/19/2017): NO MALIGNANT CELLS IDENTIFIED. Preliminary Diagnosis DX: BENIGN  BRONCHS ([facility]) Enid Cutter MD Pathologist, Electronic Signature  Assessment / Plan:   1/Stable LEFT lower lobe pulmonary nodule over six-month interval. Compared to 06/14/2017 also demonstrates no significant change.  I have reviewed with the  patient the CT scan done today the left lower lobe nodule appears stable at this point the patient does not wish to proceed with surgical resection but is willing to have a repeat CT scan done in 8 months.  With the previous negative biopsy and no change in the size of the nodule this is reasonable approach.    2/Hypermetabolic 1.1 cm mass centered at the inferior margin of the deep lobe of the left parotid gland -  Seen by ENT Wilburn Cornelia -benign   I  spent 15 minutes with  the patient face to face and greater then 50% of the time was spent in counseling and coordination of care.  Grace Isaac MD      [address] [place] [address] Office [phone]   Beeper [phone]  03/27/2018 5:08 PM

## 2018-05-12 DIAGNOSIS — I10 Essential (primary) hypertension: Secondary | ICD-10-CM | POA: Diagnosis not present

## 2018-05-12 DIAGNOSIS — E7800 Pure hypercholesterolemia, unspecified: Secondary | ICD-10-CM | POA: Diagnosis not present

## 2018-09-05 IMAGING — CT CT ABD-PELV W/ CM
2 of 5 series · 9 of 46 positions shown, 10 images · IV contrast (Iodine)
Comparison: 12/09/2012

CLINICAL DATA: LEFT lower quadrant abdominal pain, history
hypertension and smoking

EXAM:
CT ABDOMEN AND PELVIS WITH CONTRAST
TECHNIQUE: Multidetector CT imaging of the abdomen and pelvis was performed
using the standard protocol following bolus administration of
intravenous contrast. Sagittal and coronal MPR images reconstructed
from axial data set.
CONTRAST:  100mL HCEEQA-9KK IOPAMIDOL (HCEEQA-9KK) INJECTION 61% IV.
Dilute oral contrast.

[Series 201: routine, idose (2) · axial · 0.87mm/px · z∈[-547,-177]mm · 6 of 96 slices shown, 7 images]
[im 11/96  soft-tissue]
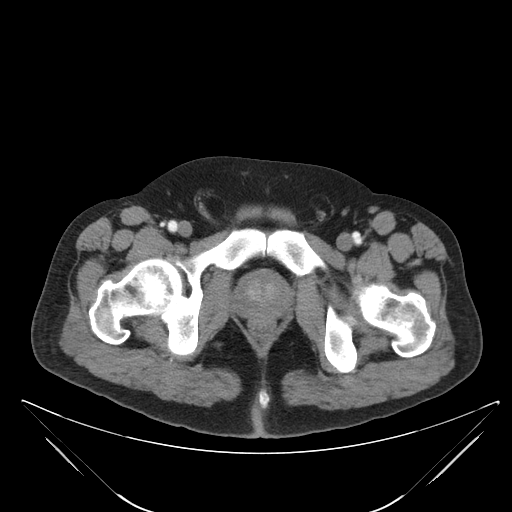
[im 11/96  bone]
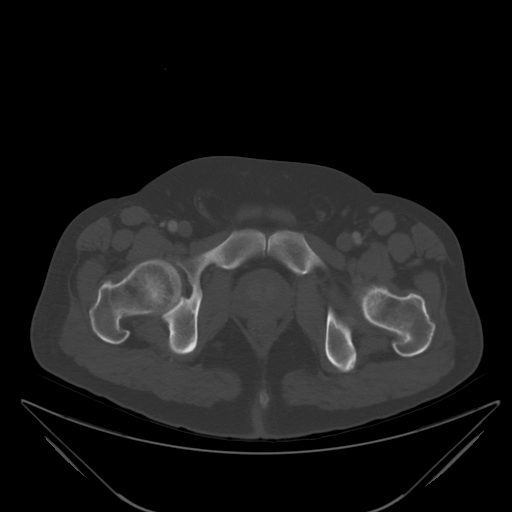
[im 27/96  soft-tissue]
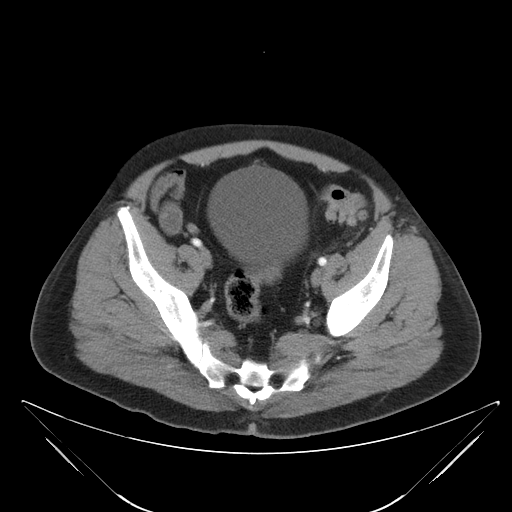
[im 43/96  soft-tissue]
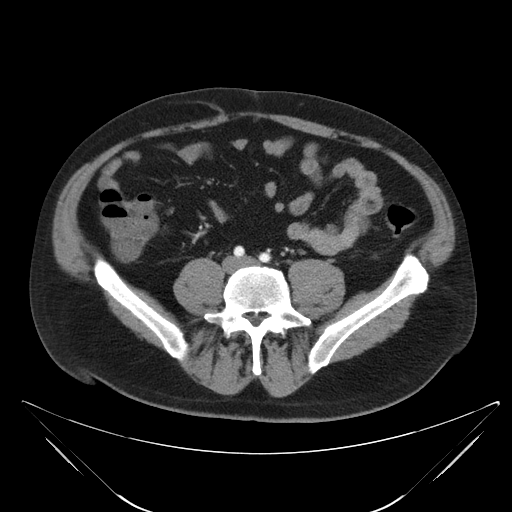
[im 53/96  soft-tissue]
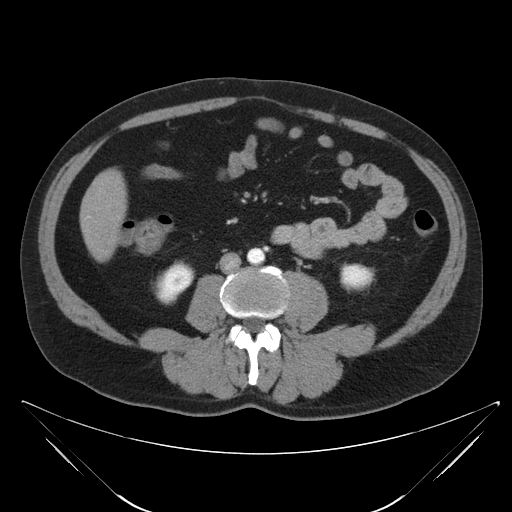
[im 69/96  soft-tissue]
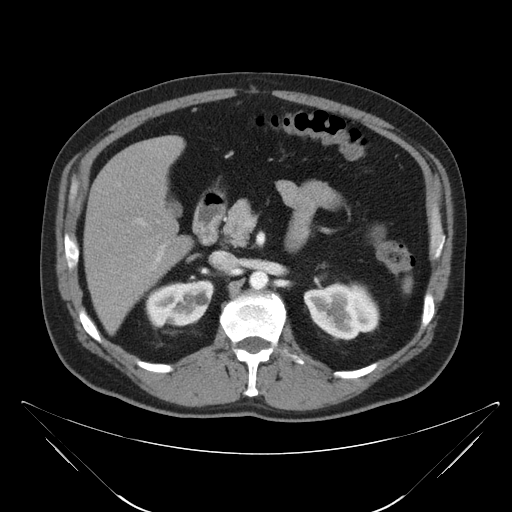
[im 85/96  soft-tissue]
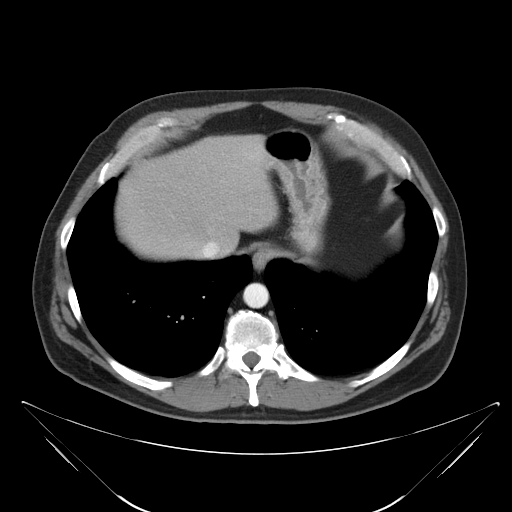

[Series 203: coronals, idose (2) · coronal · 0.45mm/px · 3 of 160 slices shown]
[im 54/160  soft-tissue]
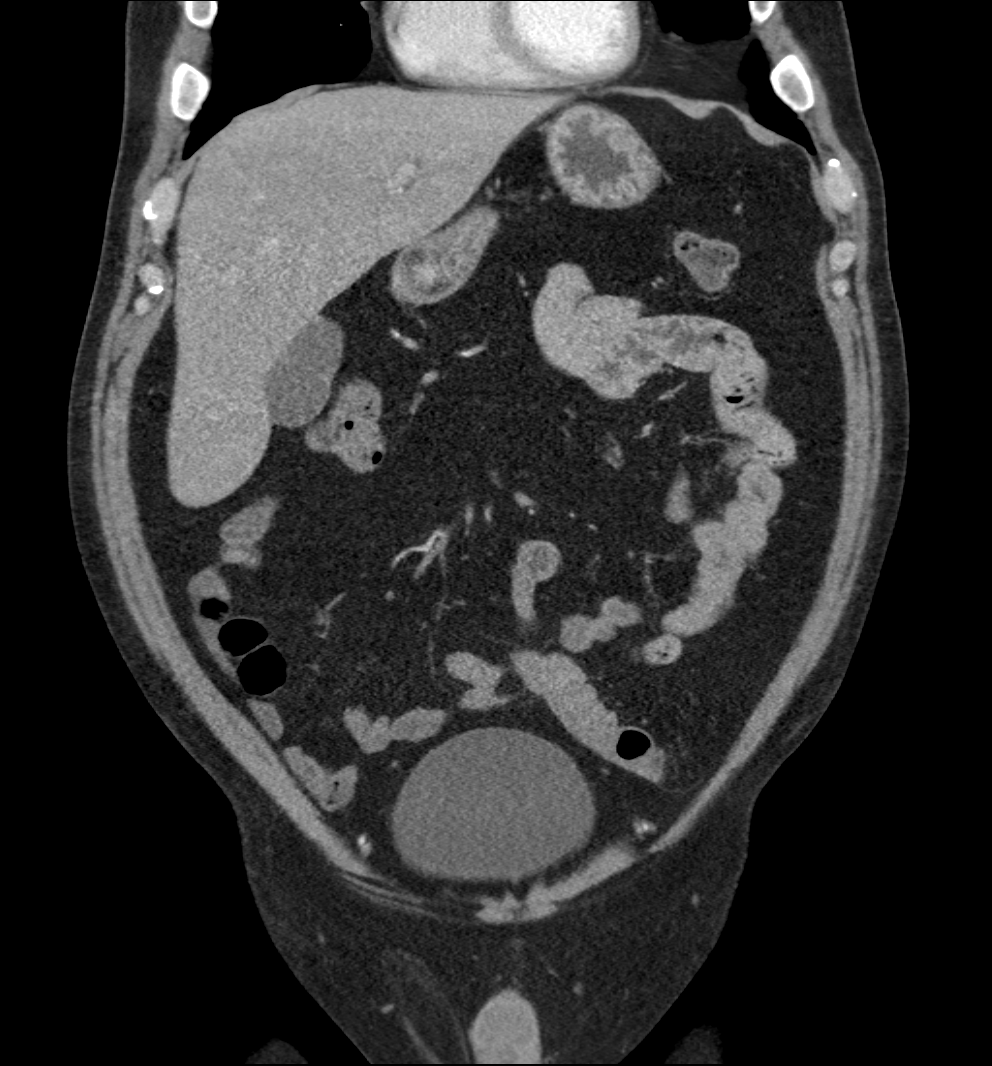
[im 71/160  soft-tissue]
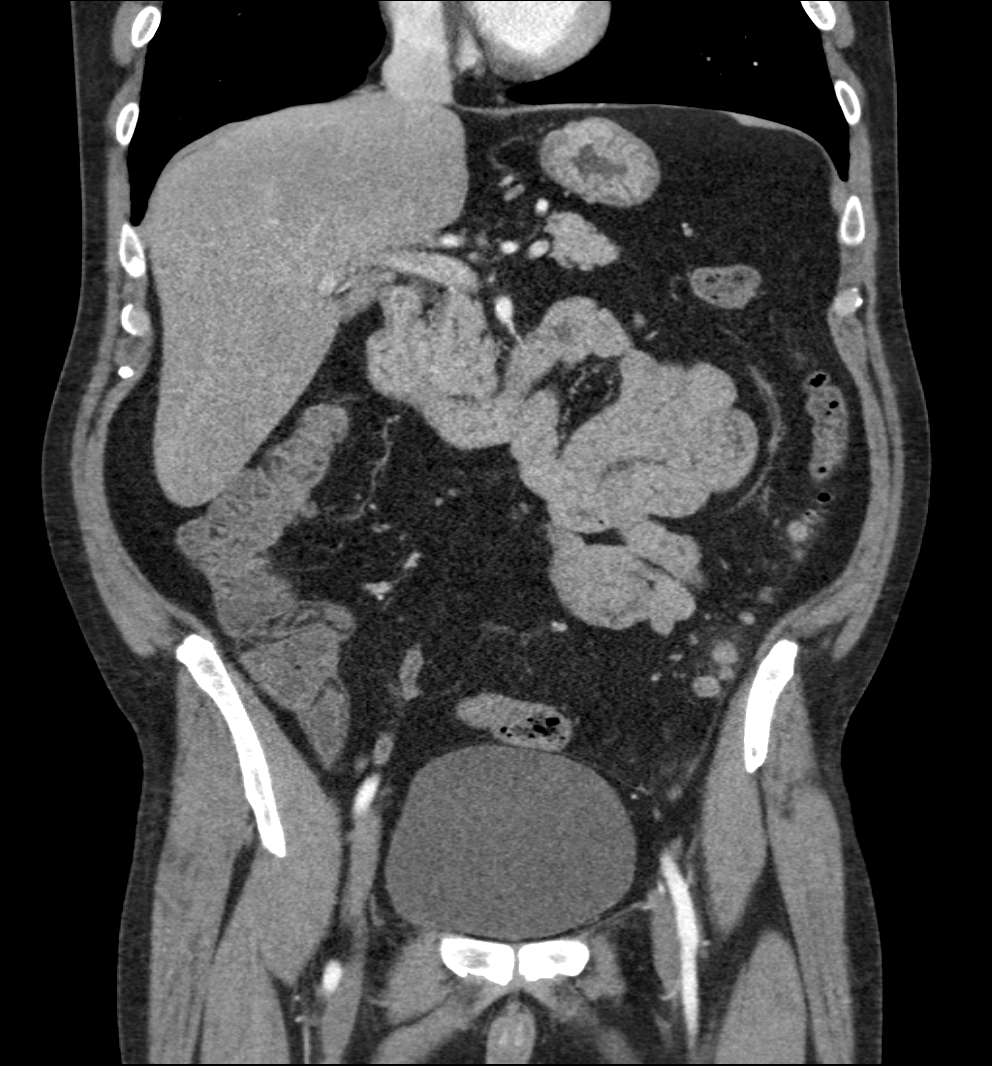
[im 89/160  soft-tissue]
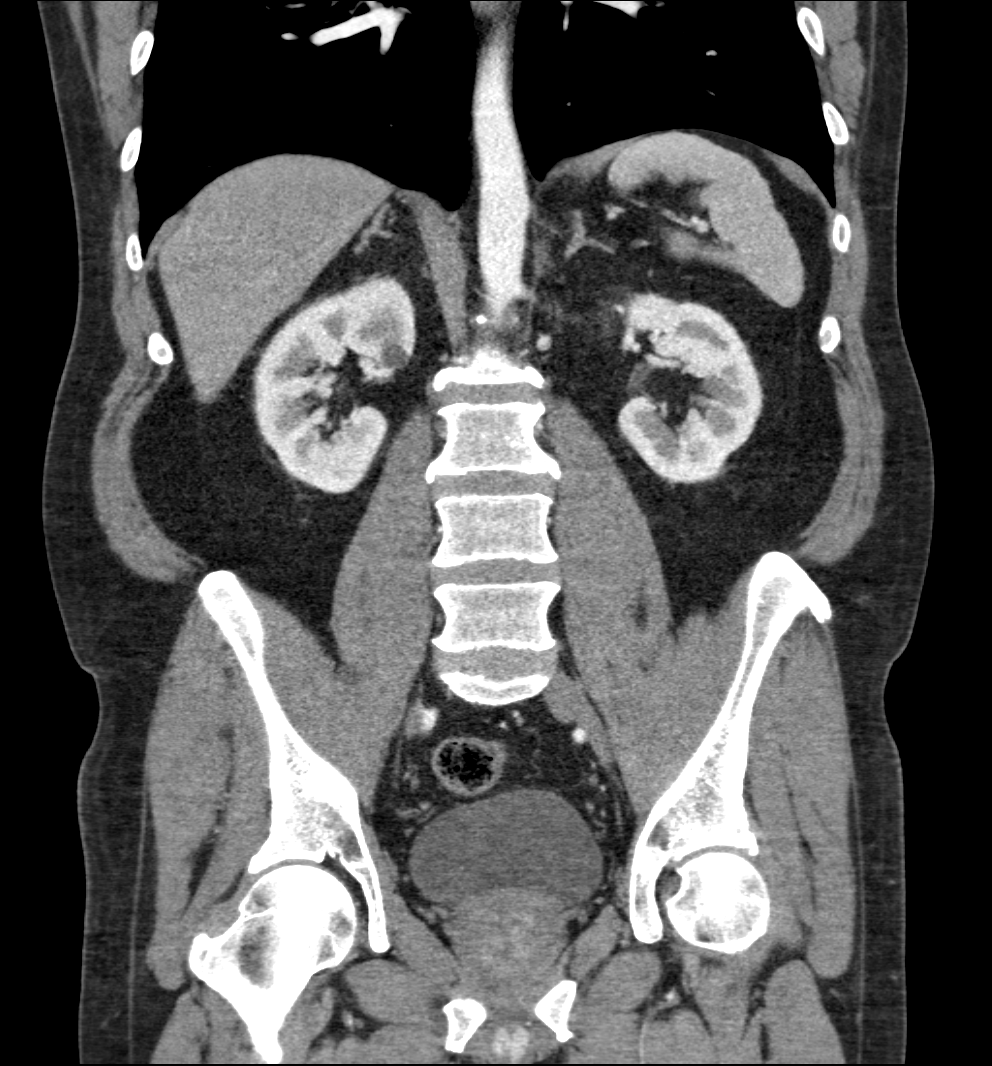

[9 of 46 positions shown; findings below may reference images not displayed]

FINDINGS: Lower chest: Question ground-glass infiltrate versus non solid
opacity at the LEFT lower lobe image 1 measuring 15 mm diameter.
Remaining lung bases clear.

Hepatobiliary: Gallbladder and liver normal appearance

Pancreas: Normal appearance

Spleen: Normal appearance

Adrenals/Urinary Tract: Small BILATERAL renal cysts. Adrenal glands,
kidneys, ureters, and bladder otherwise normal appearance.

Stomach/Bowel: Normal appendix. Diverticulosis of descending and
sigmoid colon with mild wall thickening and subtle infiltrative
changes of sigmoid mesocolon and pericolic fat at the descending
sigmoid junction suspicious for subtle diverticulitis. No evidence
of perforation or abscess. Stomach and remaining bowel loops
unremarkable.

Vascular/Lymphatic: Normal appearance

Reproductive: Prostatic enlargement gland 6.3 x 4.7 cm image 84.
Unremarkable seminal vesicles

Other: Small BILATERAL inguinal hernias containing fat. No free air
or free fluid. Small ventral fascial defects supraumbilical and at
umbilicus on sagittal view without bowel herniation.

Musculoskeletal: No acute osseous findings.
IMPRESSION: Descending and sigmoid colonic diverticulosis with subtle acute
diverticulitis changes at the descending sigmoid junction.

Small BILATERAL inguinal hernias containing fat.

Prostatic enlargement.

Question ground-glass infiltrate versus non solid opacity LEFT lower
lobe 15 mm in diameter ; followup CT chest recommended in 3 months
to determine persistence and exclude non solid neoplasm.

## 2018-10-01 DIAGNOSIS — Z23 Encounter for immunization: Secondary | ICD-10-CM | POA: Diagnosis not present

## 2018-10-23 ENCOUNTER — Other Ambulatory Visit: Payer: Self-pay | Admitting: Cardiothoracic Surgery

## 2018-10-23 DIAGNOSIS — R911 Solitary pulmonary nodule: Secondary | ICD-10-CM

## 2018-11-18 DIAGNOSIS — E7800 Pure hypercholesterolemia, unspecified: Secondary | ICD-10-CM | POA: Diagnosis not present

## 2018-11-18 DIAGNOSIS — Z0000 Encounter for general adult medical examination without abnormal findings: Secondary | ICD-10-CM | POA: Diagnosis not present

## 2018-12-11 ENCOUNTER — Ambulatory Visit: Payer: 59 | Admitting: Cardiothoracic Surgery

## 2018-12-11 ENCOUNTER — Encounter: Payer: Self-pay | Admitting: Cardiothoracic Surgery

## 2018-12-11 ENCOUNTER — Ambulatory Visit
Admission: RE | Admit: 2018-12-11 | Discharge: 2018-12-11 | Disposition: A | Discharge status: Home / Self Care | Payer: 59 | Source: Ambulatory Visit | Attending: Cardiothoracic Surgery | Admitting: Cardiothoracic Surgery

## 2018-12-11 ENCOUNTER — Other Ambulatory Visit: Payer: Self-pay

## 2018-12-11 VITALS — BP 127/87 | HR 75 | Resp 18 | Ht 70.0 in | Wt 232.4 lb

## 2018-12-11 DIAGNOSIS — R911 Solitary pulmonary nodule: Secondary | ICD-10-CM | POA: Diagnosis not present

## 2018-12-11 NOTE — Progress Notes (Signed)
[address]       [place] [address]             [phone]                    [name] [facility] Medical Record #[id number] Date of Birth: Aug 15, 1961  Referring: Kelton Pillar, MD Primary Care: Kelton Pillar, MD  Chief Complaint:   Left lung Mass  History of Present Illness:    Dakota Santos 57 y.o. male is seen in follow-up for a left lung mass noted incidentially on ct of abdomen done in Feb 2018 for abdominal pain. The patient had repeated episodes of abdominal pain since February. In July 2018  he underwent sigmoid colon resection for repeated episodes of diverticulitis.   Patient is long term smoker more then 40 years, he quit in April 2018.   Patient has previous dx of COPD, he had been prescribed Spiriva, but could not tolerate and stop using. He has no pulmonary symptoms at rest does note sob if climbs two flights of stairs   No previous cardiac history   The patient has had abdominal CT in February 2018, again and April 2018, CT scan of the chest August 2018 and again late December 2018-series of the scans left lower lobe lung lesion appears stable.   a biopsy was performed with navigation bronchoscopy January 2019 1 quick stain initially was read as atypical cells, the final interpretation of all the biopsies are negative for malignancy.  A PET scan was done in January 2019.  Patient had follow-up CT scan 8 months ago, he now returns with a follow-up CT  Patient returns today with a follow-up CT scan, previously a PET scan was done before the biopsy in January 2019:  Not smoking for 22 months  Current Activity/ Functional Status  Patient is independent with mobility/ambulation, transfers, ADL's, IADL's.   Zubrod Score: At the time of surgery this patients most appropriate activity status/level should be described as: []     0    Normal activity, no symptoms [x]     1    Restricted in physical strenuous activity but ambulatory,  able to do out light work []     2    Ambulatory and capable of self care, unable to do work activities, up and about               >50 % of waking hours                              []     3    Only limited self care, in bed greater than 50% of waking hours []     4    Completely disabled, no self care, confined to bed or chair []     5    Moribund   Past Medical History:  Diagnosis Date   Diverticulitis    Hyperlipidemia    Hypertension     Past Surgical History:  Procedure Laterality Date   COLONOSCOPY     COLONOSCOPY     HERNIA REPAIR      abdomen   LAPAROSCOPIC SIGMOID COLECTOMY N/A 05/17/2017   Procedure: LAPAROSCOPIC ASSISTED SIGMOID COLECTOMY;  Surgeon: Coralie Keens, MD;  Location: [facility];  Service: General;  Laterality: N/A;   LASIK     LUNG BIOPSY N/A 11/19/2017   Procedure: LUNG BIOPSY;  Surgeon: Lanelle Bal  B, MD;  Location: [facility];  Service: Thoracic;  Laterality: N/A;   ORIF ELBOW FRACTURE Left 09/18/2014   Procedure: OPEN REDUCTION INTERNAL FIXATION (ORIF) ELBOW/OLECRANON FRACTURE LEFT;  Surgeon: Nita Sells, MD;  Location: [facility];  Service: Orthopedics;  Laterality: Left;   UPPER GASTROINTESTINAL ENDOSCOPY     VIDEO BRONCHOSCOPY WITH ENDOBRONCHIAL NAVIGATION N/A 11/19/2017   Procedure: VIDEO BRONCHOSCOPY WITH ENDOBRONCHIAL NAVIGATION;  Surgeon: Grace Isaac, MD;  Location: [facility] OR;  Service: Thoracic;  Laterality: N/A;    Family History  Problem Relation Age of Onset   Atrial fibrillation Mother    Prostate cancer Father     Social History   Socioeconomic History   Marital status: Married    Spouse name: Not on file   Number of children: Not on file   Years of education: Not on file   Highest education level: Not on file  Occupational History   Occupation: Financial trader  Social Needs   Financial resource strain: Not on file   Food insecurity:    Worry: Not on file    Inability: Not on file   Transportation needs:      Medical: Not on file    Non-medical: Not on file  Tobacco Use   Smoking status: Former Smoker    Packs/day: 0.50    Years: 40.00    Pack years: 20.00    Types: Cigarettes    Last attempt to quit: 02/2017    Years since quitting: 1.8   Smokeless tobacco: Current User    Types: Snuff  Substance and Sexual Activity   Alcohol use: Yes    Comment: 1 drink a  -week- Beer, wine, or liquor   Drug use: No   Sexual activity: Not on file  Lifestyle   Physical activity:    Days per week: Not on file    Minutes per session: Not on file   Stress: Not on file  Relationships   Social connections:    Talks on phone: Not on file    Gets together: Not on file    Attends religious service: Not on file    Active member of club or organization: Not on file    Attends meetings of clubs or organizations: Not on file    Relationship status: Not on file   Intimate partner violence:    Fear of current or ex partner: Not on file    Emotionally abused: Not on file    Physically abused: Not on file    Forced sexual activity: Not on file  Other Topics Concern   Not on file  Social History Narrative   Not on file    Social History   Tobacco Use  Smoking Status Former Smoker   Packs/day: 0.50   Years: 40.00   Pack years: 20.00   Types: Cigarettes   Last attempt to quit: 02/2017   Years since quitting: 1.8  Smokeless Tobacco Current User   Types: Snuff    Social History   Substance and Sexual Activity  Alcohol Use Yes   Comment: 1 drink a  -week- Beer, wine, or liquor     Allergies  Allergen Reactions   Simvastatin Other (See Comments)    myalgias   Penicillins Rash    Has patient had a PCN reaction causing immediate rash, facial/tongue/throat swelling, SOB or lightheadedness with hypotension: Unknown Has patient had a PCN reaction causing severe rash involving mucus membranes or skin necrosis: Unknown Has patient had a PCN  reaction that required  hospitalization: Unknown Has patient had a PCN reaction occurring within the last 10 years: Yes If all of the above answers are "NO", then may proceed with Cephalosporin use.     Current Outpatient Medications  Medication Sig Dispense Refill   aspirin EC 81 MG tablet Take 81 mg by mouth daily.     ezetimibe (ZETIA) 10 MG tablet Take 10 mg by mouth daily.     fluticasone (FLONASE) 50 MCG/ACT nasal spray Place 1 spray into both nostrils daily as needed for allergies or rhinitis.     lisinopril-hydrochlorothiazide (PRINZIDE,ZESTORETIC) 20-25 MG tablet Take 0.5 tablets by mouth daily. (Patient taking differently: Take 1 tablet by mouth daily. )     PROCTOZONE-HC 2.5 % rectal cream Place 1 application rectally daily.      No current facility-administered medications for this visit.     Pertinent items are noted in HPI.   Review of Systems:  Review of Systems  Constitutional: Negative.   HENT: Negative.   Eyes: Negative.   Respiratory: Negative.   Cardiovascular: Negative.   Gastrointestinal: Negative.   Genitourinary: Negative.   Musculoskeletal: Negative.   Skin: Negative.   Neurological: Negative.   Endo/Heme/Allergies: Negative.   Psychiatric/Behavioral: Negative.     Immunizations: Flu up to date Blue.Reese  ]; Pneumococcal up to date [n  ];   Physical Exam: BP 127/87 (BP Location: Right Arm, Patient Position: Sitting, Cuff Size: Large)    Pulse 75    Resp 18    Ht 5\' 10"  (1.778 m)    Wt 232 lb 6.4 oz (105.4 kg)    SpO2 97% Comment: RA   BMI 33.35 kg/m   PHYSICAL EXAMINATION: General appearance: alert, cooperative, appears stated age and no distress Head: Normocephalic, without obvious abnormality, atraumatic Neck: no adenopathy, no carotid bruit, no JVD, supple, symmetrical, trachea midline and thyroid not enlarged, symmetric, no tenderness/mass/nodules Lymph nodes: Cervical, supraclavicular, and axillary nodes normal. Resp: clear to auscultation bilaterally Cardio:  regular rate and rhythm, S1, S2 normal, no murmur, click, rub or gallop GI: soft, non-tender; bowel sounds normal; no masses,  no organomegaly and healed abdominal incision from colon resection  Extremities: extremities normal, atraumatic, no cyanosis or edema Neurologic: Grossly normal   Diagnostic Studies & Laboratory data:     Recent Radiology Findings:  Ct Chest Wo Contrast  Result Date: 12/11/2018 CLINICAL DATA:  Evaluate lung nodule. EXAM: CT CHEST WITHOUT CONTRAST TECHNIQUE: Multidetector CT imaging of the chest was performed following the standard protocol without IV contrast. COMPARISON:  03/27/2018 FINDINGS: Cardiovascular: The heart size appears normal. No pericardial effusion. Mediastinum/Nodes: Normal appearance of the thyroid gland. The trachea appears patent and is midline. Normal appearance of the esophagus. No axillary or mediastinal adenopathy. Lungs/Pleura: No pleural effusion. No airspace consolidation or atelectasis. Pulmonary nodule within the left lower lobe is again noted. This is predominantly solid with a faint peripheral area of ground-glass attenuation, image 89/8. The solid component on today's study measures 0.9 by 0.7 cm. When the ground-glass portion is included this nodule has a total diameter of 1.3 cm. When compared with study from 06/14/2017 this nodule is not significantly changed in size. Upper Abdomen: No acute abnormality. Musculoskeletal: No chest wall mass or suspicious bone lesions identified. IMPRESSION: 1. Stable nodule within the left lower lobe when compared with 06/14/2017. Consider continued Annual surveillance of this indeterminate nodule. Electronically Signed   By: Kerby Moors M.D.   On: 12/11/2018 11:19   I have independently  reviewed the above radiology studies  and reviewed the findings with the patient.  Ct Chest Wo Contrast  Result Date: 03/27/2018 CLINICAL DATA:  Follow-up lung nodule.  Previous smoker EXAM: CT CHEST WITHOUT CONTRAST  TECHNIQUE: Multidetector CT imaging of the chest was performed following the standard protocol without IV contrast. COMPARISON:  Chest CT 11/07/2017, CT 06/14/2017, PET-CT 06/25/2017 FINDINGS: Cardiovascular: No significant vascular findings. Normal heart size. No pericardial effusion. Mediastinum/Nodes: No axillary supraclavicular adenopathy. No mediastinal hilar adenopathy. No pericardial effusion. Esophagus normal Lungs/Pleura: Within LEFT lower lobe 8 mm x 6 mm nodule (image 95/8) the compares with 11 mm x 7 mm. Visually the nodule appears similar. No new pulmonary nodules. Upper Abdomen: Limited view of the liver, kidneys, pancreas are unremarkable. Normal adrenal glands. Musculoskeletal: No aggressive osseous lesion. IMPRESSION: Stable LEFT lower lobe pulmonary nodule over six-month interval. Compared to 06/14/2017 also demonstrates no significant change. No metabolic activity on PET scan. Recommend follow-up CT in 12 months from current month. Electronically Signed   By: Suzy Bouchard M.D.   On: 03/27/2018 15:14   I have independently reviewed the above radiology studies  and reviewed the findings with the patient.   Ct Super D Chest Wo Contrast  Result Date: 07/03/2017 CLINICAL DATA:  Follow up pulmonary nodule. Previous colectomy. No history of malignancy. Former smoker. EXAM: CT CHEST WITHOUT CONTRAST TECHNIQUE: Multidetector CT imaging of the chest was performed using thin slice collimation for electromagnetic bronchoscopy planning purposes, without intravenous contrast. COMPARISON:  Chest CT 06/14/2017. PET-CT 06/25/2017. Abdominal CT 12/25/2016 and 02/28/2017. FINDINGS: Cardiovascular: No significant vascular findings on noncontrast imaging. The heart size is normal. There is no pericardial effusion. Mediastinum/Nodes: There are no enlarged mediastinal, hilar or axillary lymph nodes. The thyroid gland, trachea and esophagus demonstrate no significant findings. Lungs/Pleura: There is no pleural  effusion. The sub solid left lower lobe nodule is unchanged from the recent prior studies, measuring 11 x 7 mm on image 71. There are no new or enlarging pulmonary nodules. There is no endobronchial lesion. Upper abdomen: Borderline hepatic steatosis. No evidence of adrenal mass. Probable small cysts in the upper pole of the right kidney. Musculoskeletal/Chest wall: There is no chest wall mass or suspicious osseous finding. IMPRESSION: 1. Follow-up imaging for electro magnetic bronchoscopy planning. 2. The sub solid left lower lobe nodule is unchanged from the recent prior studies. 3. No acute findings. Electronically Signed   By: Richardean Sale M.D.   On: 07/03/2017 10:53     Ct Chest Wo Contrast  Result Date: 06/14/2017 CLINICAL DATA:  Follow-up nodule EXAM: CT CHEST WITHOUT CONTRAST TECHNIQUE: Multidetector CT imaging of the chest was performed following the standard protocol without IV contrast. COMPARISON:  02/28/2017 CT I FINDINGS: Cardiovascular: No evidence of aortic aneurysm or intramural hematoma. Mediastinum/Nodes: No abnormal mediastinal adenopathy. Visualized thyroid is unremarkable. Esophagus is unremarkable. No pericardial effusion. Lungs/Pleura: Left lower lobe mixed solid and ground-glass opacity on image 88 measures 1.4 cm and is stable dating back to February 13th of this year. The solid component measures 8 mm. No new nodule. Upper Abdomen: No acute abnormality. Musculoskeletal: No acute bony pathology. IMPRESSION: Persistence solid and sub solid left lower lobe nodule. The solid component is 8 mm. This nodule is highly suspicious for malignancy. Thoracic surgery consultation is recommended. Follow-up non-contrast CT recommended at 3-6 months to confirm persistence. If unchanged, and solid component remains <6 mm, annual CT is recommended until 5 years of stability has been established. If persistent these nodules should  be considered highly suspicious if the solid component of the nodule is  6 mm or greater in size and enlarging. This recommendation follows the consensus statement: Guidelines for Management of Incidental Pulmonary Nodules Detected on CT Images: From the Fleischner Society 2017; Radiology 2017; 284:228-243. None Electronically Signed   By: Marybelle Killings M.D.   On: 06/14/2017 09:12   Nm Pet Image Initial (pi) Skull Base To Thigh  Result Date: 06/25/2017 CLINICAL DATA:  Initial treatment strategy for persistent subsolid left lower lobe pulmonary nodule. Status post laparoscopic sigmoid colectomy 05/17/2017 for recurrent sigmoid diverticulitis. EXAM: NUCLEAR MEDICINE PET SKULL BASE TO THIGH TECHNIQUE: 10.9 mCi F-18 FDG was injected intravenously. Full-ring PET imaging was performed from the skull base to thigh after the radiotracer. CT data was obtained and used for attenuation correction and anatomic localization. FASTING BLOOD GLUCOSE:  Value: 103 mg/dl COMPARISON:  06/14/2017 chest CT.  02/28/2017 CT abdomen/pelvis. FINDINGS: NECK: No hypermetabolic lymph nodes in the neck. Hypermetabolic 1.1 cm mass centered at the inferior margin of the deep lobe of the left parotid gland (series 4/image 26) with max SUV 7.4. Symmetric hypermetabolism in Waldeyer's ring without discrete mass correlate on the CT images, probably reactive. CHEST: Subsolid irregular 1.2 cm left lower lobe pulmonary nodule (series 8/image 40) demonstrates no significant metabolism (max SUV 0.7). No acute consolidative airspace disease, lung masses or additional significant pulmonary nodules. No enlarged or hypermetabolic axillary, mediastinal or hilar lymph nodes. No pneumothorax. No pleural effusions. ABDOMEN/PELVIS: No abnormal hypermetabolic activity within the liver, pancreas, adrenal glands, or spleen. No hypermetabolic lymph nodes in the abdomen or pelvis. Fat stranding in the midline subcutaneous ventral abdominal wall with associated mild patchy hypermetabolism is compatible with expected postsurgical  inflammatory change. No superficial fluid collections. Status post subtotal distal colectomy with intact appearing distal colonic anastomosis. Mild scattered diverticulosis throughout the remnant colon. Atherosclerotic nonaneurysmal abdominal aorta. Mild diffuse hepatic steatosis. Small simple renal cysts in both kidneys, largest 1.3 cm in the medial upper right kidney. Top-normal size prostate. SKELETON: No focal hypermetabolic activity to suggest skeletal metastasis. IMPRESSION: 1. Subsolid 1.2 cm left lower lobe pulmonary nodule demonstrates no significant metabolism. Differential continues to include low grade lung adenocarcinoma. Thoracic surgical consultation advised for consideration of surgical resection. Annual CT is recommended until 5 years of stability has been established if surgery is not elected. This recommendation follows the consensus statement: Guidelines for Management of Incidental Pulmonary Nodules Detected on CT Images: From the Fleischner Society 2017; Radiology 2017; 284:228-243. 2. Hypermetabolic 1.1 cm mass centered at the inferior margin of the deep lobe of the left parotid gland, suspicious for primary left parotid neoplasm, potentially malignant. Dedicated left parotid ultrasound with ultrasound-guided biopsy recommended. 3. Expected postsurgical changes in the midline ventral abdominal wall from interval subtotal distal colectomy. No fluid collections. Electronically Signed   By: Ilona Sorrel M.D.   On: 06/25/2017 12:05       Recent Lab Findings: Lab Results  Component Value Date   WBC 6.8 11/19/2017   HGB 15.4 11/19/2017   HCT 46.6 11/19/2017   PLT 332 11/19/2017   GLUCOSE 96 11/19/2017   ALT 55 11/19/2017   AST 58 (H) 11/19/2017   NA 136 11/19/2017   K 5.4 (H) 11/19/2017   CL 104 11/19/2017   CREATININE 1.04 11/19/2017   BUN 11 11/19/2017   CO2 21 (L) 11/19/2017   INR 0.97 11/19/2017   HGBA1C 6.0 (H) 05/08/2017   PFT's- reviewed done prior to last visit  FEV1  2.77 73% DLCO 26 81%  Conclusions: Minimal airway obstruction is present suggesting small airway disease. Pulmonary Function Diagnosis: Mild Obstructive Airways Disease Insignificant response to bronchodilator Normal Difusion   PATH:Diagnosis Lung, biopsy, Left Lower Lobe - BENIGN LUNG PARENCHYMA. - THERE IS NO EVIDENCE OF MALIGNANCY. Enid Cutter MD Cytology: Diagnosis WANG, FINE NEEDLE ASPIRATION, LLL, E (SPECIMEN 5 OF 5, COLLECTED 11/19/17): NO MALIGNANT CELLS IDENTIFIED. Enid Cutter MD Acc#: Chart: Phone:  Fax: LMP: Visit#: [id number] CC: Kelton Pillar, MD Curt Bears, MD CYTOPATHOLOGY REPORT Adequacy Reason Satisfactory For Evaluation. Diagnosis TRANSBRONCHIAL NEEDLE ASPIRATION (D) NAVIGATION LEFT LOWER LOBE BRUSHING (SPECIMEN 4 OF 5, COLLECTED ON 11/19/2017): NO MALIGNANT CELLS IDENTIFIED. Preliminary Diagnosis DX: BENIGN (DR . OUT OF ROOM) ([facility]) Enid Cutter MD Pathologist, Electronic Signature (Case signed 11/20/2017) Adequacy Reason Satisfactory For Evaluation. Diagnosis BRONCHIAL BRUSHING, LLL, TRIPLE BRUSH, C (SPECIMEN 3 OF 5, COLLECTED 11/19/17): NO MALIGNANT CELLS IDENTIFIED. Enid Cutter MD Adequacy Reason Satisfactory For Evaluation. Diagnosis TRANSBRONCHIAL NEEDLE ASPIRATION, NAVIGATION, LLL, B (SPECIMEN 2 OF 5, COLLECTED 11/19/17): NO MALIGNANT CELLS IDENTIFIED. Preliminary Diagnosis DX: RARE ATYPICAL SUSP FOR MALIGNANCY ([facility]) Enid Cutter MD Adequacy Reason Satisfactory For Evaluation. Diagnosis TRANSBRONCHIAL NEEDLE ASPIRATION (A) NAVIGATION LEFT LOWER LOBE BRUSHING (SPECIMEN 1 OF 5, COLLECTED ON 11/19/2017): NO MALIGNANT CELLS IDENTIFIED. Preliminary Diagnosis DX: BENIGN BRONCHS ([facility]) Enid Cutter MD Pathologist, Electronic Signature  Assessment / Plan:  #1 stable left lower lobe pulmonary nodule over 39-month interval, compared back to originally noted on abdominal CT scan June 14, 2017.  I reviewed with the patient the CT scan done  today for follow-up.  The patient does not wish to proceed with repeat biopsy or surgical resection but is agreeable to a follow-up CT scan in 8 months.  With previous final path on navigation bronchoscopy being negative and the size of the nodule not changing this is a reasonable approach.  Plan follow-up CT 8 months    2/Hypermetabolic 1.1 cm mass centered at the inferior margin of the deep lobe of the left parotid gland -  Seen by ENT Wilburn Cornelia -benign  I  spent 15 minutes with  the patient face to face and greater then 50% of the time was spent in counseling and coordination of care.       [address] [place] [address] Office [phone]   Beeper [phone]  12/11/2018 11:34 AM

## 2019-01-10 DIAGNOSIS — Z23 Encounter for immunization: Secondary | ICD-10-CM | POA: Diagnosis not present

## 2019-06-19 ENCOUNTER — Other Ambulatory Visit: Payer: Self-pay | Admitting: Cardiothoracic Surgery

## 2019-06-19 DIAGNOSIS — R911 Solitary pulmonary nodule: Secondary | ICD-10-CM

## 2019-08-11 ENCOUNTER — Other Ambulatory Visit: Payer: Self-pay | Admitting: Cardiothoracic Surgery

## 2019-08-13 ENCOUNTER — Other Ambulatory Visit: Payer: Self-pay

## 2019-08-13 ENCOUNTER — Ambulatory Visit
Admission: RE | Admit: 2019-08-13 | Discharge: 2019-08-13 | Disposition: A | Discharge status: Home / Self Care | Payer: 59 | Source: Ambulatory Visit | Attending: Cardiothoracic Surgery | Admitting: Cardiothoracic Surgery

## 2019-08-13 ENCOUNTER — Ambulatory Visit (INDEPENDENT_AMBULATORY_CARE_PROVIDER_SITE_OTHER): Payer: 59 | Admitting: Cardiothoracic Surgery

## 2019-08-13 ENCOUNTER — Encounter: Payer: Self-pay | Admitting: Cardiothoracic Surgery

## 2019-08-13 VITALS — BP 132/85 | HR 69 | Temp 97.8°F | Resp 20 | Ht 70.0 in | Wt 232.0 lb

## 2019-08-13 DIAGNOSIS — R911 Solitary pulmonary nodule: Secondary | ICD-10-CM

## 2019-08-13 NOTE — Progress Notes (Signed)
[name]       [place] [address]             [phone]                    Fitzgerald M [name] [id number] Date of Birth: [birth year]  Referring: Kelton Pillar, MD Primary Care: Kelton Pillar, MD  Chief Complaint:   Left lung Mass  History of Present Illness:    Dakota Santos 58 y.o. male is seen in follow-up for a left lung mass noted incidentially on ct of abdomen done in Feb 2018 for abdominal pain. The patient had repeated episodes of abdominal pain since February. In July 2018  he underwent sigmoid colon resection for repeated episodes of diverticulitis.   Patient is long term smoker more then 40 years, he quit in April 2018.   Patient has previous dx of COPD, he had been prescribed Spiriva, but could not tolerate and stop using. He has no pulmonary symptoms at rest does note sob if climbs two flights of stairs   No previous cardiac history   The patient has had abdominal CT in February 2018, again and April 2018, CT scan of the chest August 2018 and again late December 2018-series of the scans left lower lobe lung lesion appears stable.   a biopsy was performed with navigation bronchoscopy January 2019 1 quick stain initially was read as atypical cells, the final interpretation of all the biopsies are negative for malignancy.  A PET scan was done in January 2019.  Patient had follow-up CT scan 8 months ago, he now returns with a follow-up CT  Patient returns today with a follow-up CT scan, previously a PET scan was done before the biopsy in January 2019:  Not smoking for since first seen in 2018  Current Activity/ Functional Status  Patient is independent with mobility/ambulation, transfers, ADL's, IADL's.   Zubrod Score: At the time of surgery this patients most appropriate activity status/level should be described as: []     0    Normal activity, no symptoms [x]     1    Restricted in physical strenuous activity  but ambulatory, able to do out light work []     2    Ambulatory and capable of self care, unable to do work activities, up and about               >50 % of waking hours                              []     3    Only limited self care, in bed greater than 50% of waking hours []     4    Completely disabled, no self care, confined to bed or chair []     5    Moribund   Past Medical History:  Diagnosis Date   Diverticulitis    Hyperlipidemia    Hypertension     Past Surgical History:  Procedure Laterality Date   COLONOSCOPY     COLONOSCOPY     HERNIA REPAIR      abdomen   LAPAROSCOPIC SIGMOID COLECTOMY N/A 05/17/2017   Procedure: LAPAROSCOPIC ASSISTED SIGMOID COLECTOMY;  Surgeon: Coralie Keens, MD;  Location: [facility];  Service: General;  Laterality: N/A;   LASIK     LUNG BIOPSY N/A 11/19/2017   Procedure: LUNG BIOPSY;  Surgeon: Grace Isaac, MD;  Location[facility] OR;  Service: Thoracic;  Laterality: N/A;   ORIF ELBOW FRACTURE Left 09/18/2014   Procedure: OPEN REDUCTION INTERNAL FIXATION (ORIF) ELBOW/OLECRANON FRACTURE LEFT;  Surgeon: Nita Sells, MD;  Location: [facility];  Service: Orthopedics;  Laterality: Left;   UPPER GASTROINTESTINAL ENDOSCOPY     VIDEO BRONCHOSCOPY WITH ENDOBRONCHIAL NAVIGATION N/A 11/19/2017   Procedure: VIDEO BRONCHOSCOPY WITH ENDOBRONCHIAL NAVIGATION;  Surgeon: Grace Isaac, MD;  Location: [facility] OR;  Service: Thoracic;  Laterality: N/A;    Family History  Problem Relation Age of Onset   Atrial fibrillation Mother    Prostate cancer Father     Social History   Socioeconomic History   Marital status: Married    Spouse name: Not on file   Number of children: Not on file   Years of education: Not on file   Highest education level: Not on file  Occupational History   Occupation: Financial trader  Social Needs   Financial resource strain: Not on file   Food insecurity    Worry: Not on file    Inability: Not on file    Transportation needs    Medical: Not on file    Non-medical: Not on file  Tobacco Use   Smoking status: Former Smoker    Packs/day: 0.50    Years: 40.00    Pack years: 20.00    Types: Cigarettes    Quit date: 02/2017    Years since quitting: 2.5   Smokeless tobacco: Current User    Types: Snuff  Substance and Sexual Activity   Alcohol use: Yes    Comment: 1 drink a  -week- Beer, wine, or liquor   Drug use: No   Sexual activity: Not on file  Lifestyle   Physical activity    Days per week: Not on file    Minutes per session: Not on file   Stress: Not on file  Relationships   Social connections    Talks on phone: Not on file    Gets together: Not on file    Attends religious service: Not on file    Active member of club or organization: Not on file    Attends meetings of clubs or organizations: Not on file    Relationship status: Not on file   Intimate partner violence    Fear of current or ex partner: Not on file    Emotionally abused: Not on file    Physically abused: Not on file    Forced sexual activity: Not on file  Other Topics Concern   Not on file  Social History Narrative   Not on file    Social History   Tobacco Use  Smoking Status Former Smoker   Packs/day: 0.50   Years: 40.00   Pack years: 20.00   Types: Cigarettes   Quit date: 02/2017   Years since quitting: 2.5  Smokeless Tobacco Current User   Types: Snuff    Social History   Substance and Sexual Activity  Alcohol Use Yes   Comment: 1 drink a  -week- Beer, wine, or liquor     Allergies  Allergen Reactions   Simvastatin Other (See Comments)    myalgias   Penicillins Rash    Has patient had a PCN reaction causing immediate rash, facial/tongue/throat swelling, SOB or lightheadedness with hypotension: Unknown Has patient had a PCN reaction causing severe rash involving mucus membranes or skin necrosis: Unknown Has patient had a PCN reaction that  required  hospitalization: Unknown Has patient had a PCN reaction occurring within the last 10 years: Yes If all of the above answers are "NO", then may proceed with Cephalosporin use.     Current Outpatient Medications  Medication Sig Dispense Refill   aspirin EC 81 MG tablet Take 81 mg by mouth daily.     ezetimibe (ZETIA) 10 MG tablet Take 10 mg by mouth daily.     fluticasone (FLONASE) 50 MCG/ACT nasal spray Place 1 spray into both nostrils daily as needed for allergies or rhinitis.     lisinopril-hydrochlorothiazide (PRINZIDE,ZESTORETIC) 20-25 MG tablet Take 0.5 tablets by mouth daily. (Patient taking differently: Take 1 tablet by mouth daily. )     PROCTOZONE-HC 2.5 % rectal cream Place 1 application rectally daily.      No current facility-administered medications for this visit.     Pertinent items are noted in HPI.   Review of Systems:  Review of Systems  HENT: Negative.   Eyes: Negative.   Respiratory: Negative.   Cardiovascular: Negative.   Gastrointestinal: Negative.   Genitourinary: Negative.   Musculoskeletal: Negative.   Skin: Negative.   Neurological: Negative.   Endo/Heme/Allergies: Negative.   Psychiatric/Behavioral: Negative.    Immunizations: Flu up to date Blue.Reese  ]; Pneumococcal up to date [n  ];   Physical Exam: There were no vitals taken for this visit.  General appearance: alert, cooperative and no distress Head: Normocephalic, without obvious abnormality, atraumatic Neck: no adenopathy, no carotid bruit, no JVD, supple, symmetrical, trachea midline and thyroid not enlarged, symmetric, no tenderness/mass/nodules Lymph nodes: Cervical, supraclavicular, and axillary nodes normal. Resp: clear to auscultation bilaterally Cardio: regular rate and rhythm, S1, S2 normal, no murmur, click, rub or gallop GI: soft, non-tender; bowel sounds normal; no masses,  no organomegaly Extremities: extremities normal, atraumatic, no cyanosis or edema and Homans sign is  negative, no sign of DVT Neurologic: Grossly normal   Diagnostic Studies & Laboratory data:     Recent Radiology Findings:  Ct Chest Wo Contrast  Result Date: 08/13/2019 CLINICAL DATA:  Follow-up indeterminate left lung nodule. EXAM: CT CHEST WITHOUT CONTRAST TECHNIQUE: Multidetector CT imaging of the chest was performed following the standard protocol without IV contrast. COMPARISON:  12/11/2018 and 06/14/2017 FINDINGS: Cardiovascular:  No acute findings. Mediastinum/Nodes: No masses or pathologically enlarged lymph nodes identified on this unenhanced exam. Lungs/Pleura: 11 mm pulmonary nodule in the central left lower lobe remains stable or minimally decreased in size compared to prior studies dating back to 2018. This is consistent with a benign postinflammatory etiology. No new or enlarging pulmonary nodules or masses identified. No evidence of pulmonary infiltrate or pleural effusion. Upper Abdomen:  Unremarkable. Musculoskeletal:  No suspicious bone lesions. IMPRESSION: Stable left lower lobe pulmonary nodule, consistent with benign etiology. No active lung disease or other acute findings. Electronically Signed   By: Marlaine Hind M.D.   On: 08/13/2019 10:56   I have independently reviewed the above radiology studies  and reviewed the findings with the patient.  Ct Chest Wo Contrast  Result Date: 03/27/2018 CLINICAL DATA:  Follow-up lung nodule.  Previous smoker EXAM: CT CHEST WITHOUT CONTRAST TECHNIQUE: Multidetector CT imaging of the chest was performed following the standard protocol without IV contrast. COMPARISON:  Chest CT 11/07/2017, CT 06/14/2017, PET-CT 06/25/2017 FINDINGS: Cardiovascular: No significant vascular findings. Normal heart size. No pericardial effusion. Mediastinum/Nodes: No axillary supraclavicular adenopathy. No mediastinal hilar adenopathy. No pericardial effusion. Esophagus normal Lungs/Pleura: Within LEFT lower lobe 8 mm x 6  mm nodule (image 95/8) the compares with 11  mm x 7 mm. Visually the nodule appears similar. No new pulmonary nodules. Upper Abdomen: Limited view of the liver, kidneys, pancreas are unremarkable. Normal adrenal glands. Musculoskeletal: No aggressive osseous lesion. IMPRESSION: Stable LEFT lower lobe pulmonary nodule over six-month interval. Compared to 06/14/2017 also demonstrates no significant change. No metabolic activity on PET scan. Recommend follow-up CT in 12 months from current month. Electronically Signed   By: Suzy Bouchard M.D.   On: 03/27/2018 15:14   I have independently reviewed the above radiology studies  and reviewed the findings with the patient.   Ct Super D Chest Wo Contrast  Result Date: 07/03/2017 CLINICAL DATA:  Follow up pulmonary nodule. Previous colectomy. No history of malignancy. Former smoker. EXAM: CT CHEST WITHOUT CONTRAST TECHNIQUE: Multidetector CT imaging of the chest was performed using thin slice collimation for electromagnetic bronchoscopy planning purposes, without intravenous contrast. COMPARISON:  Chest CT 06/14/2017. PET-CT 06/25/2017. Abdominal CT 12/25/2016 and 02/28/2017. FINDINGS: Cardiovascular: No significant vascular findings on noncontrast imaging. The heart size is normal. There is no pericardial effusion. Mediastinum/Nodes: There are no enlarged mediastinal, hilar or axillary lymph nodes. The thyroid gland, trachea and esophagus demonstrate no significant findings. Lungs/Pleura: There is no pleural effusion. The sub solid left lower lobe nodule is unchanged from the recent prior studies, measuring 11 x 7 mm on image 71. There are no new or enlarging pulmonary nodules. There is no endobronchial lesion. Upper abdomen: Borderline hepatic steatosis. No evidence of adrenal mass. Probable small cysts in the upper pole of the right kidney. Musculoskeletal/Chest wall: There is no chest wall mass or suspicious osseous finding. IMPRESSION: 1. Follow-up imaging for electro magnetic bronchoscopy planning. 2.  The sub solid left lower lobe nodule is unchanged from the recent prior studies. 3. No acute findings. Electronically Signed   By: Richardean Sale M.D.   On: 07/03/2017 10:53     Ct Chest Wo Contrast  Result Date: 06/14/2017 CLINICAL DATA:  Follow-up nodule EXAM: CT CHEST WITHOUT CONTRAST TECHNIQUE: Multidetector CT imaging of the chest was performed following the standard protocol without IV contrast. COMPARISON:  02/28/2017 CT I FINDINGS: Cardiovascular: No evidence of aortic aneurysm or intramural hematoma. Mediastinum/Nodes: No abnormal mediastinal adenopathy. Visualized thyroid is unremarkable. Esophagus is unremarkable. No pericardial effusion. Lungs/Pleura: Left lower lobe mixed solid and ground-glass opacity on image 88 measures 1.4 cm and is stable dating back to February 13th of this year. The solid component measures 8 mm. No new nodule. Upper Abdomen: No acute abnormality. Musculoskeletal: No acute bony pathology. IMPRESSION: Persistence solid and sub solid left lower lobe nodule. The solid component is 8 mm. This nodule is highly suspicious for malignancy. Thoracic surgery consultation is recommended. Follow-up non-contrast CT recommended at 3-6 months to confirm persistence. If unchanged, and solid component remains <6 mm, annual CT is recommended until 5 years of stability has been established. If persistent these nodules should be considered highly suspicious if the solid component of the nodule is 6 mm or greater in size and enlarging. This recommendation follows the consensus statement: Guidelines for Management of Incidental Pulmonary Nodules Detected on CT Images: From the Fleischner Society 2017; Radiology 2017; 284:228-243. None Electronically Signed   By: Marybelle Killings M.D.   On: 06/14/2017 09:12   Nm Pet Image Initial (pi) Skull Base To Thigh  Result Date: 06/25/2017 CLINICAL DATA:  Initial treatment strategy for persistent subsolid left lower lobe pulmonary nodule. Status post  laparoscopic sigmoid colectomy 05/17/2017  for recurrent sigmoid diverticulitis. EXAM: NUCLEAR MEDICINE PET SKULL BASE TO THIGH TECHNIQUE: 10.9 mCi F-18 FDG was injected intravenously. Full-ring PET imaging was performed from the skull base to thigh after the radiotracer. CT data was obtained and used for attenuation correction and anatomic localization. FASTING BLOOD GLUCOSE:  Value: 103 mg/dl COMPARISON:  06/14/2017 chest CT.  02/28/2017 CT abdomen/pelvis. FINDINGS: NECK: No hypermetabolic lymph nodes in the neck. Hypermetabolic 1.1 cm mass centered at the inferior margin of the deep lobe of the left parotid gland (series 4/image 26) with max SUV 7.4. Symmetric hypermetabolism in Waldeyer's ring without discrete mass correlate on the CT images, probably reactive. CHEST: Subsolid irregular 1.2 cm left lower lobe pulmonary nodule (series 8/image 40) demonstrates no significant metabolism (max SUV 0.7). No acute consolidative airspace disease, lung masses or additional significant pulmonary nodules. No enlarged or hypermetabolic axillary, mediastinal or hilar lymph nodes. No pneumothorax. No pleural effusions. ABDOMEN/PELVIS: No abnormal hypermetabolic activity within the liver, pancreas, adrenal glands, or spleen. No hypermetabolic lymph nodes in the abdomen or pelvis. Fat stranding in the midline subcutaneous ventral abdominal wall with associated mild patchy hypermetabolism is compatible with expected postsurgical inflammatory change. No superficial fluid collections. Status post subtotal distal colectomy with intact appearing distal colonic anastomosis. Mild scattered diverticulosis throughout the remnant colon. Atherosclerotic nonaneurysmal abdominal aorta. Mild diffuse hepatic steatosis. Small simple renal cysts in both kidneys, largest 1.3 cm in the medial upper right kidney. Top-normal size prostate. SKELETON: No focal hypermetabolic activity to suggest skeletal metastasis. IMPRESSION: 1. Subsolid 1.2 cm left  lower lobe pulmonary nodule demonstrates no significant metabolism. Differential continues to include low grade lung adenocarcinoma. Thoracic surgical consultation advised for consideration of surgical resection. Annual CT is recommended until 5 years of stability has been established if surgery is not elected. This recommendation follows the consensus statement: Guidelines for Management of Incidental Pulmonary Nodules Detected on CT Images: From the Fleischner Society 2017; Radiology 2017; 284:228-243. 2. Hypermetabolic 1.1 cm mass centered at the inferior margin of the deep lobe of the left parotid gland, suspicious for primary left parotid neoplasm, potentially malignant. Dedicated left parotid ultrasound with ultrasound-guided biopsy recommended. 3. Expected postsurgical changes in the midline ventral abdominal wall from interval subtotal distal colectomy. No fluid collections. Electronically Signed   By: Ilona Sorrel M.D.   On: 06/25/2017 12:05       Recent Lab Findings: Lab Results  Component Value Date   WBC 6.8 11/19/2017   HGB 15.4 11/19/2017   HCT 46.6 11/19/2017   PLT 332 11/19/2017   GLUCOSE 96 11/19/2017   ALT 55 11/19/2017   AST 58 (H) 11/19/2017   NA 136 11/19/2017   K 5.4 (H) 11/19/2017   CL 104 11/19/2017   CREATININE 1.04 11/19/2017   BUN 11 11/19/2017   CO2 21 (L) 11/19/2017   INR 0.97 11/19/2017   HGBA1C 6.0 (H) 05/08/2017   PFT's- reviewed done prior to last visit  FEV1 2.77 73% DLCO 26 81%  Conclusions: Minimal airway obstruction is present suggesting small airway disease. Pulmonary Function Diagnosis: Mild Obstructive Airways Disease Insignificant response to bronchodilator Normal Difusion   PATH:Diagnosis Lung, biopsy, Left Lower Lobe - BENIGN LUNG PARENCHYMA. - THERE IS NO EVIDENCE OF MALIGNANCY. Enid Cutter MD Cytology: Diagnosis WANG, FINE NEEDLE ASPIRATION, LLL, E (SPECIMEN 5 OF 5, COLLECTED 11/19/17): NO MALIGNANT CELLS IDENTIFIED. Enid Cutter  MD Acc#: Chart: Phone:  Fax: LMP: Visit#: [id number] CC: Kelton Pillar, MD Curt Bears, MD CYTOPATHOLOGY REPORT Adequacy Reason Satisfactory For Evaluation.  Diagnosis TRANSBRONCHIAL NEEDLE ASPIRATION (D) NAVIGATION LEFT LOWER LOBE BRUSHING (SPECIMEN 4 OF 5, COLLECTED ON 11/19/2017): NO MALIGNANT CELLS IDENTIFIED. Preliminary Diagnosis DX: BENIGN (DR . OUT OF ROOM) ([facility]) Enid Cutter MD Pathologist, Electronic Signature (Case signed 11/20/2017) Adequacy Reason Satisfactory For Evaluation. Diagnosis BRONCHIAL BRUSHING, LLL, TRIPLE BRUSH, C (SPECIMEN 3 OF 5, COLLECTED 11/19/17): NO MALIGNANT CELLS IDENTIFIED. Enid Cutter MD Adequacy Reason Satisfactory For Evaluation. Diagnosis TRANSBRONCHIAL NEEDLE ASPIRATION, NAVIGATION, LLL, B (SPECIMEN 2 OF 5, COLLECTED 11/19/17): NO MALIGNANT CELLS IDENTIFIED. Preliminary Diagnosis DX: RARE ATYPICAL SUSP FOR MALIGNANCY ([facility]) Enid Cutter MD Adequacy Reason Satisfactory For Evaluation. Diagnosis TRANSBRONCHIAL NEEDLE ASPIRATION (A) NAVIGATION LEFT LOWER LOBE BRUSHING (SPECIMEN 1 OF 5, COLLECTED ON 11/19/2017): NO MALIGNANT CELLS IDENTIFIED. Preliminary Diagnosis DX: BENIGN BRONCHS ([facility]) Enid Cutter MD Pathologist, Electronic Signature  Assessment / Plan:  #1 stable left lower lobe pulmonary nodule 11 mm-with no change on her most recent CT. with negative biopsy, negative PET scan and no change over 2 years-we will refer the patient to the lung cancer screening program for continued yearly CT of the chest.-This was discussed with the patient is agreeable with this approach     2/Hypermetabolic 1.1 cm mass centered at the inferior margin of the deep lobe of the left parotid gland -  Seen by ENT Wilburn Cornelia -benign  I  spent 15 minutes with  the patient face to face       [address] [place] [address] Office [phone]   [place]  08/13/2019 12:30 PM

## 2019-10-24 ENCOUNTER — Other Ambulatory Visit: Payer: Self-pay | Admitting: Cardiothoracic Surgery

## 2019-10-24 DIAGNOSIS — R911 Solitary pulmonary nodule: Secondary | ICD-10-CM

## 2020-02-15 ENCOUNTER — Ambulatory Visit: Payer: 59 | Attending: Internal Medicine

## 2020-02-15 DIAGNOSIS — Z23 Encounter for immunization: Secondary | ICD-10-CM

## 2020-02-15 NOTE — Progress Notes (Signed)
   Covid-19 Vaccination Clinic  Name:  Dakota Santos    MRN: [id number] DOB: 03-Feb-1961  02/15/2020  [name] was observed post Covid-19 immunization for 15 minutes without incident. He was provided with Vaccine Information Sheet and instruction to access the V-Safe system.   [name] was instructed to call 911 with any severe reactions post vaccine: Marland Kitchen Difficulty breathing  . Swelling of face and throat  . A fast heartbeat  . A bad rash all over body  . Dizziness and weakness   Immunizations Administered    Name Date Dose VIS Date Route   Pfizer COVID-19 Vaccine 02/15/2020 12:19 PM 0.3 mL 10/23/2019 Intramuscular   Manufacturer: [place]   Lot: [id number]   [place]: [id number]

## 2020-03-07 ENCOUNTER — Ambulatory Visit: Payer: 59 | Attending: Internal Medicine

## 2020-03-07 DIAGNOSIS — Z23 Encounter for immunization: Secondary | ICD-10-CM

## 2020-03-07 NOTE — Progress Notes (Signed)
   Covid-19 Vaccination Clinic  Name:  Dakota Santos    MRN: [id number] DOB: 05/17/1961  03/07/2020  [name] was observed post Covid-19 immunization for 15 minutes without incident. He was provided with Vaccine Information Sheet and instruction to access the V-Safe system.   [name] was instructed to call 911 with any severe reactions post vaccine: Marland Kitchen Difficulty breathing  . Swelling of face and throat  . A fast heartbeat  . A bad rash all over body  . Dizziness and weakness   Immunizations Administered    Name Date Dose VIS Date Route   Pfizer COVID-19 Vaccine 03/07/2020  4:01 PM 0.3 mL 01/06/2019 Intramuscular   Manufacturer: [place]   Lot: [id number]   [place]: [id number]

## 2020-08-15 ENCOUNTER — Telehealth: Payer: Self-pay | Admitting: Acute Care

## 2020-08-15 DIAGNOSIS — Z87891 Personal history of nicotine dependence: Secondary | ICD-10-CM

## 2020-08-15 NOTE — Telephone Encounter (Signed)
Spoke with pt and scheduled SDMV 09/07/20 9:00 CT ordered Nothing further needed

## 2020-09-07 ENCOUNTER — Encounter: Payer: Self-pay | Admitting: Acute Care

## 2020-09-07 ENCOUNTER — Ambulatory Visit (INDEPENDENT_AMBULATORY_CARE_PROVIDER_SITE_OTHER): Payer: 59 | Admitting: Acute Care

## 2020-09-07 ENCOUNTER — Other Ambulatory Visit: Payer: Self-pay

## 2020-09-07 ENCOUNTER — Ambulatory Visit
Admission: RE | Admit: 2020-09-07 | Discharge: 2020-09-07 | Disposition: A | Discharge status: Home / Self Care | Payer: 59 | Source: Ambulatory Visit | Attending: Acute Care | Admitting: Acute Care

## 2020-09-07 VITALS — BP 128/80 | HR 65 | Temp 97.3°F | Ht 70.0 in | Wt 233.0 lb

## 2020-09-07 DIAGNOSIS — Z122 Encounter for screening for malignant neoplasm of respiratory organs: Secondary | ICD-10-CM

## 2020-09-07 DIAGNOSIS — Z87891 Personal history of nicotine dependence: Secondary | ICD-10-CM

## 2020-09-07 NOTE — Progress Notes (Signed)
Shared Decision Making Visit Lung Cancer Screening Program [phone])   Eligibility:  Age 59 y.o.  Pack Years Smoking History Calculation 40 pack year smoking history (# packs/per year x # years smoked)  Recent History of coughing up blood  no  Unexplained weight loss? no ( >Than 15 pounds within the last 6 months )  Prior History Lung / other cancer no (Diagnosis within the last 5 years already requiring surveillance chest CT Scans).  Smoking Status Former Smoker  Former Smokers: Years since quit: 3 years  Quit Date: 2018  Visit Components:  Discussion included one or more decision making aids. yes  Discussion included risk/benefits of screening. yes  Discussion included potential follow up diagnostic testing for abnormal scans. yes  Discussion included meaning and risk of over diagnosis. yes  Discussion included meaning and risk of False Positives. yes  Discussion included meaning of total radiation exposure. yes  Counseling Included:  Importance of adherence to annual lung cancer LDCT screening. yes  Impact of comorbidities on ability to participate in the program. yes  Ability and willingness to under diagnostic treatment. yes  Smoking Cessation Counseling:  Current Smokers:   Discussed importance of smoking cessation. yes  Information about tobacco cessation classes and interventions provided to patient. yes  Patient provided with "ticket" for LDCT Scan. yes  Symptomatic Patient. no  Counseling NA  Diagnosis Code: Tobacco Use Z72.0  Asymptomatic Patient yes  Counseling (Intermediate counseling: > three minutes counseling) L2440  Former Smokers:   Discussed the importance of maintaining cigarette abstinence. yes  Diagnosis Code: Personal History of Nicotine Dependence. N02.725  Information about tobacco cessation classes and interventions provided to patient. Yes  Patient provided with "ticket" for LDCT Scan. yes  Written Order for Lung  Cancer Screening with LDCT placed in Epic. Yes (CT Chest Lung Cancer Screening Low Dose W/O CM) [id number] Z12.2-Screening of respiratory organs Z87.891-Personal history of nicotine dependence  BP 128/80 (BP Location: Left Arm, Cuff Size: Normal)   Pulse 65   Temp (!) 97.3 F (36.3 C) (Oral)   Ht 5\' 10"  (1.778 m)   Wt 233 lb (105.7 kg)   SpO2 98%   BMI 33.43 kg/m    I spent 25 minutes of face to face time with Mr. Dakota Santos discussing the risks and benefits of lung cancer screening. We viewed a power point together that explained in detail the above noted topics. We took the time to pause the power point at intervals to allow for questions to be asked and answered to ensure understanding. We discussed that he had taken the single most powerful action possible to decrease his risk of developing lung cancer when he quit smoking. I counseled him to remain smoke free, and to contact me if he ever had the desire to smoke again so that I can provide resources and tools to help support the effort to remain smoke free. We discussed the time and location of the scan, and that either  Doroteo Glassman RN or I will call with the results within  24-48 hours of receiving them. He has my card and contact information in the event he needs to speak with me, in addition to a copy of the power point we reviewed as a resource. He verbalized understanding of all of the above and had no further questions upon leaving the office.     I explained to the patient that there has been a high incidence of coronary artery disease noted on these exams. I explained  that this is a non-gated exam therefore degree or severity cannot be determined. This patient is not currently on statin therapy. I have asked the patient to follow-up with their PCP regarding any incidental finding of coronary artery disease and management with diet or medication as they feel is clinically indicated. The patient verbalized understanding of the above and had  no further questions.     Magdalen Spatz, NP 09/07/2020 9:36 AM

## 2020-09-07 NOTE — Patient Instructions (Signed)
Thank you for participating in the [place] [facility] Cancer Screening Program. It was our pleasure to meet you today. We will call you with the results of your scan within the next few days. Your scan will be assigned a Lung RADS category score by the physicians reading the scans.  This Lung RADS score determines follow up scanning.  See below for description of categories, and follow up screening recommendations. We will be in touch to schedule your follow up screening annually or based on recommendations of our providers. We will fax a copy of your scan results to your Primary Care Physician, or the physician who referred you to the program, to ensure they have the results. Please call the office if you have any questions or concerns regarding your scanning experience or results.  Our office number is [phone]. Please speak with Denise Phelps, RN. She is our Lung Cancer Screening RN. If she is unavailable when you call, please have the office staff send her a message. She will return your call at her earliest convenience. Remember, if your scan is normal, we will scan you annually as long as you continue to meet the criteria for the program. (Age 55-77, Current smoker or smoker who has quit within the last 15 years). If you are a smoker, remember, quitting is the single most powerful action that you can take to decrease your risk of lung cancer and other pulmonary, breathing related problems. We know quitting is hard, and we are here to help.  Please let us know if there is anything we can do to help you meet your goal of quitting. If you are a former smoker, congratulations. We are proud of you! Remain smoke free! Remember you can refer friends or family members through the number above.  We will screen them to make sure they meet criteria for the program. Thank you for helping us take better care of you by participating in Lung Screening.  Lung RADS Categories:  Lung RADS 1: no nodules  or definitely non-concerning nodules.  Recommendation is for a repeat annual scan in 12 months.  Lung RADS 2:  nodules that are non-concerning in appearance and behavior with a very low likelihood of becoming an active cancer. Recommendation is for a repeat annual scan in 12 months.  Lung RADS 3: nodules that are probably non-concerning , includes nodules with a low likelihood of becoming an active cancer.  Recommendation is for a 6-month repeat screening scan. Often noted after an upper respiratory illness. We will be in touch to make sure you have no questions, and to schedule your 6-month scan.  Lung RADS 4 A: nodules with concerning findings, recommendation is most often for a follow up scan in 3 months or additional testing based on our provider's assessment of the scan. We will be in touch to make sure you have no questions and to schedule the recommended 3 month follow up scan.  Lung RADS 4 B:  indicates findings that are concerning. We will be in touch with you to schedule additional diagnostic testing based on our provider's  assessment of the scan.   

## 2020-09-08 NOTE — Progress Notes (Signed)
I have attempted to call the patient with the results of his low dose CT. There was no answer. I have left a HIPPA compliant message on his VM. We will await his return call. Langley Gauss, this was read as a lung rads 2, but because it looks a bit concerning, [facility] wants to do a 6 month follow up.  Then follow up with Dr. Valeta Harms after the 6 month scan in one of his pulmonary nodule clots. Let me know if you have any questions. Thanks

## 2020-09-09 ENCOUNTER — Other Ambulatory Visit: Payer: Self-pay | Admitting: *Deleted

## 2020-09-09 DIAGNOSIS — Z87891 Personal history of nicotine dependence: Secondary | ICD-10-CM

## 2021-02-24 ENCOUNTER — Ambulatory Visit (HOSPITAL_BASED_OUTPATIENT_CLINIC_OR_DEPARTMENT_OTHER): Payer: 59

## 2021-02-24 ENCOUNTER — Other Ambulatory Visit: Payer: Self-pay

## 2021-02-24 ENCOUNTER — Ambulatory Visit (HOSPITAL_BASED_OUTPATIENT_CLINIC_OR_DEPARTMENT_OTHER)
Admission: RE | Admit: 2021-02-24 | Discharge: 2021-02-24 | Disposition: A | Discharge status: Home / Self Care | Payer: 59 | Source: Ambulatory Visit | Attending: Acute Care | Admitting: Acute Care

## 2021-02-24 DIAGNOSIS — Z87891 Personal history of nicotine dependence: Secondary | ICD-10-CM | POA: Insufficient documentation

## 2021-03-10 NOTE — Progress Notes (Signed)
Please call patient and let them  know their  low dose Ct was read as a Lung RADS 2: nodules that are benign in appearance and behavior with a very low likelihood of becoming a clinically active cancer due to size or lack of growth. Recommendation per radiology is for a repeat LDCT in 12 months. .Please let them  know we will order and schedule their  annual screening scan for 02/2022. Pt.  is currently on statin therapy. Please place order for annual  screening scan for  02/2022 and fax results to PCP. Thanks so much.

## 2021-03-13 ENCOUNTER — Telehealth: Payer: Self-pay | Admitting: Acute Care

## 2021-03-13 DIAGNOSIS — Z87891 Personal history of nicotine dependence: Secondary | ICD-10-CM

## 2021-03-13 NOTE — Telephone Encounter (Signed)
Pt informed of CT results per Sarah Groce, NP.  PT verbalized understanding.  Copy sent to PCP.  Order placed for 1 yr f/u CT.  

## 2022-03-22 ENCOUNTER — Other Ambulatory Visit: Payer: Self-pay | Admitting: *Deleted

## 2022-03-22 DIAGNOSIS — Z122 Encounter for screening for malignant neoplasm of respiratory organs: Secondary | ICD-10-CM

## 2022-03-22 DIAGNOSIS — Z87891 Personal history of nicotine dependence: Secondary | ICD-10-CM

## 2022-03-28 ENCOUNTER — Ambulatory Visit (HOSPITAL_BASED_OUTPATIENT_CLINIC_OR_DEPARTMENT_OTHER)
Admission: RE | Admit: 2022-03-28 | Discharge: 2022-03-28 | Disposition: A | Discharge status: Home / Self Care | Payer: 59 | Source: Ambulatory Visit | Attending: Acute Care | Admitting: Acute Care

## 2022-03-28 DIAGNOSIS — Z122 Encounter for screening for malignant neoplasm of respiratory organs: Secondary | ICD-10-CM | POA: Insufficient documentation

## 2022-03-28 DIAGNOSIS — Z87891 Personal history of nicotine dependence: Secondary | ICD-10-CM | POA: Insufficient documentation

## 2022-03-29 ENCOUNTER — Other Ambulatory Visit: Payer: Self-pay | Admitting: Acute Care

## 2022-03-29 DIAGNOSIS — Z122 Encounter for screening for malignant neoplasm of respiratory organs: Secondary | ICD-10-CM

## 2022-03-29 DIAGNOSIS — Z87891 Personal history of nicotine dependence: Secondary | ICD-10-CM

## 2022-07-03 ENCOUNTER — Ambulatory Visit (INDEPENDENT_AMBULATORY_CARE_PROVIDER_SITE_OTHER): Payer: 59

## 2022-07-03 ENCOUNTER — Encounter: Payer: Self-pay | Admitting: Podiatry

## 2022-07-03 ENCOUNTER — Ambulatory Visit (INDEPENDENT_AMBULATORY_CARE_PROVIDER_SITE_OTHER): Payer: 59 | Admitting: Podiatry

## 2022-07-03 DIAGNOSIS — R748 Abnormal levels of other serum enzymes: Secondary | ICD-10-CM | POA: Insufficient documentation

## 2022-07-03 DIAGNOSIS — Z8042 Family history of malignant neoplasm of prostate: Secondary | ICD-10-CM | POA: Insufficient documentation

## 2022-07-03 DIAGNOSIS — I700 Atherosclerosis of aorta: Secondary | ICD-10-CM | POA: Insufficient documentation

## 2022-07-03 DIAGNOSIS — K5730 Diverticulosis of large intestine without perforation or abscess without bleeding: Secondary | ICD-10-CM | POA: Insufficient documentation

## 2022-07-03 DIAGNOSIS — J439 Emphysema, unspecified: Secondary | ICD-10-CM | POA: Insufficient documentation

## 2022-07-03 DIAGNOSIS — M778 Other enthesopathies, not elsewhere classified: Secondary | ICD-10-CM

## 2022-07-03 DIAGNOSIS — Z860100 Personal history of colon polyps, unspecified: Secondary | ICD-10-CM | POA: Insufficient documentation

## 2022-07-03 DIAGNOSIS — E1169 Type 2 diabetes mellitus with other specified complication: Secondary | ICD-10-CM

## 2022-07-03 DIAGNOSIS — K580 Irritable bowel syndrome with diarrhea: Secondary | ICD-10-CM | POA: Insufficient documentation

## 2022-07-03 DIAGNOSIS — E7800 Pure hypercholesterolemia, unspecified: Secondary | ICD-10-CM | POA: Insufficient documentation

## 2022-07-03 MED ORDER — MELOXICAM 15 MG PO TABS: 15.0000 mg | ORAL_TABLET | Freq: Every day | ORAL | 0 refills | Status: AC

## 2022-07-03 NOTE — Progress Notes (Signed)
  Subjective:  Patient ID: Dakota Santos, male    DOB: Mar 31, 1961,   MRN: [id number]  Chief Complaint  Patient presents with   Diabetes    Diabetic Foot Exam - recently diagnosed diabetic - last a1c was 6.0, wears work boot daily and uses powerstep insoles, last podiatry eval was told cartilage was bad in toe joints, intermittent pain, medial ankle bilateral is very "bony", wants recommendations on shoes, insoles, and good foot care   New Patient (Initial Visit)    61 y.o. male presents for diabetic foot check and concern for good shoes. Also has some "bony" ankles he is concerned about and wanting recommendations.  States his right great toe has been told he has arthritis and it is starting to get more bothersome. Relates burning and tingling in their feet. Patient is diabetic and last A1c was  Lab Results  Component Value Date   HGBA1C 6.0 (H) 05/08/2017   .   PCP:  Kelton Pillar, MD    . Denies any other pedal complaints. Denies n/v/f/c.   Past Medical History:  Diagnosis Date   Diverticulitis    Hyperlipidemia    Hypertension     Objective:  Physical Exam: Vascular: DP/PT pulses 2/4 bilateral. CFT <3 seconds. Normal hair growth on digits. No edema.  Skin. No lacerations or abrasions bilateral feet.  Musculoskeletal: MMT 5/5 bilateral lower extremities in DF, PF, Inversion and Eversion. Deceased ROM in DF of ankle joint. Decreased ROM of the first MPJ on the right no pain to palpation noted.  Neurological: Sensation intact to light touch.   Assessment:   1. Type 2 diabetes mellitus with other specified complication, unspecified whether long term insulin use ([place])      Plan:  Patient was evaluated and treated and all questions answered. -Discussed and educated patient on diabetic foot care, especially with  regards to the vascular, neurological and musculoskeletal systems.  -Stressed the importance of good glycemic control and the detriment of not  controlling  glucose levels in relation to the foot. -Discussed supportive shoes at all times and checking feet regularly.  -Xrays reviewed. No acute fractures or dislocations. Degenerative changes noted to right first MPJ.  -Discussed hallux limitus and  treatement options; conservative and  Surgical management; risks, benefits, alternatives discussed. All patient's questions answered. -Rx Meloxicam provided.  -Recommend continue with good supportive shoes and inserts. Discussed stiff soled shoes and use of carbon fiber foot plate.   -Patient to return to office in 1 year for diabetic foot exam.    Lorenda Peck, DPM

## 2023-03-03 ENCOUNTER — Other Ambulatory Visit: Payer: Self-pay | Admitting: Acute Care

## 2023-03-03 DIAGNOSIS — Z122 Encounter for screening for malignant neoplasm of respiratory organs: Secondary | ICD-10-CM

## 2023-03-03 DIAGNOSIS — Z87891 Personal history of nicotine dependence: Secondary | ICD-10-CM

## 2023-04-01 ENCOUNTER — Ambulatory Visit (HOSPITAL_BASED_OUTPATIENT_CLINIC_OR_DEPARTMENT_OTHER)
Admission: RE | Admit: 2023-04-01 | Discharge: 2023-04-01 | Disposition: A | Discharge status: Home / Self Care | Payer: Managed Care, Other (non HMO) | Source: Ambulatory Visit | Attending: Acute Care | Admitting: Acute Care

## 2023-04-01 DIAGNOSIS — Z122 Encounter for screening for malignant neoplasm of respiratory organs: Secondary | ICD-10-CM | POA: Diagnosis present

## 2023-04-01 DIAGNOSIS — Z87891 Personal history of nicotine dependence: Secondary | ICD-10-CM | POA: Diagnosis present

## 2023-04-05 ENCOUNTER — Telehealth: Payer: Self-pay | Admitting: Acute Care

## 2023-04-05 DIAGNOSIS — Z87891 Personal history of nicotine dependence: Secondary | ICD-10-CM

## 2023-04-05 DIAGNOSIS — Z122 Encounter for screening for malignant neoplasm of respiratory organs: Secondary | ICD-10-CM

## 2023-04-05 NOTE — Telephone Encounter (Signed)
Called and left VM for pt to return our call regarding his results and to speak with his PCP about thyroid nodule.     IMPRESSION: 1. Lung-RADS 2, benign appearance or behavior. Continue annual screening with low-dose chest CT without contrast in 12 months. 2. 1.6 cm low-attenuation right thyroid nodule. Recommend thyroid ultrasound. (Ref: J Am Coll Radiol. 2015 Feb;12(2): 143-50). 3.  Aortic atherosclerosis (ICD10-I70.0). 4.  Emphysema (ICD10-J43.9).

## 2023-04-09 NOTE — Telephone Encounter (Signed)
Called and left detailed message (at pt's request from his return VM to [place]). Will try again to reach pt before closing.

## 2023-04-10 NOTE — Telephone Encounter (Signed)
Called and left another detailed message for pt with the results. Yearly scan has been ordered. Results report with note about thyroid US has been added. Advised pt in the VM to follow up with his PCP and to call us back if he has any questions.

## 2023-04-19 ENCOUNTER — Other Ambulatory Visit: Payer: Self-pay | Admitting: Internal Medicine

## 2023-04-19 DIAGNOSIS — E041 Nontoxic single thyroid nodule: Secondary | ICD-10-CM

## 2023-04-22 ENCOUNTER — Ambulatory Visit
Admission: RE | Admit: 2023-04-22 | Discharge: 2023-04-22 | Disposition: A | Discharge status: Home / Self Care | Payer: Managed Care, Other (non HMO) | Source: Ambulatory Visit | Attending: Internal Medicine | Admitting: Internal Medicine

## 2023-04-22 DIAGNOSIS — E041 Nontoxic single thyroid nodule: Secondary | ICD-10-CM

## 2024-04-01 ENCOUNTER — Ambulatory Visit (HOSPITAL_BASED_OUTPATIENT_CLINIC_OR_DEPARTMENT_OTHER)
Admission: RE | Admit: 2024-04-01 | Discharge: 2024-04-01 | Disposition: A | Discharge status: Home / Self Care | Source: Ambulatory Visit | Attending: Acute Care | Admitting: Acute Care

## 2024-04-01 DIAGNOSIS — Z122 Encounter for screening for malignant neoplasm of respiratory organs: Secondary | ICD-10-CM | POA: Insufficient documentation

## 2024-04-01 DIAGNOSIS — Z87891 Personal history of nicotine dependence: Secondary | ICD-10-CM | POA: Diagnosis present

## 2024-04-20 ENCOUNTER — Encounter: Payer: Self-pay | Admitting: Acute Care

## 2024-04-20 DIAGNOSIS — Z122 Encounter for screening for malignant neoplasm of respiratory organs: Secondary | ICD-10-CM

## 2024-04-20 DIAGNOSIS — Z87891 Personal history of nicotine dependence: Secondary | ICD-10-CM

## 2024-05-05 ENCOUNTER — Other Ambulatory Visit: Payer: Self-pay | Admitting: Acute Care

## 2024-05-05 DIAGNOSIS — Z87891 Personal history of nicotine dependence: Secondary | ICD-10-CM

## 2024-05-05 DIAGNOSIS — Z122 Encounter for screening for malignant neoplasm of respiratory organs: Secondary | ICD-10-CM
